# Patient Record
Sex: Female | Born: 1980 | Race: White | Hispanic: No | Marital: Single | Smoking: Never smoker
Health system: Southern US, Community
[De-identification: ages and names within clinical notes are randomized; demographics above are authoritative.]

## PROBLEM LIST (undated history)

## (undated) DIAGNOSIS — F32A Depression, unspecified: Secondary | ICD-10-CM

## (undated) DIAGNOSIS — E119 Type 2 diabetes mellitus without complications: Secondary | ICD-10-CM

## (undated) DIAGNOSIS — E079 Disorder of thyroid, unspecified: Secondary | ICD-10-CM

## (undated) DIAGNOSIS — E785 Hyperlipidemia, unspecified: Secondary | ICD-10-CM

## (undated) DIAGNOSIS — I10 Essential (primary) hypertension: Secondary | ICD-10-CM

## (undated) DIAGNOSIS — F329 Major depressive disorder, single episode, unspecified: Secondary | ICD-10-CM

## (undated) HISTORY — DX: Depression, unspecified: F32.A

## (undated) HISTORY — DX: Disorder of thyroid, unspecified: E07.9

## (undated) HISTORY — DX: Type 2 diabetes mellitus without complications: E11.9

## (undated) HISTORY — DX: Essential (primary) hypertension: I10

## (undated) HISTORY — DX: Hyperlipidemia, unspecified: E78.5

## (undated) HISTORY — DX: Major depressive disorder, single episode, unspecified: F32.9

## (undated) HISTORY — PX: WISDOM TOOTH EXTRACTION: SHX21

## (undated) HISTORY — PX: TUMOR EXCISION: SHX421

---

## 2014-05-20 ENCOUNTER — Telehealth: Payer: Self-pay | Admitting: Family Medicine

## 2014-05-20 NOTE — Telephone Encounter (Signed)
Give call to triageElnita Tate- Cheryl, FNP @ [facility] wants this pt seen for elevated blood sugar.

## 2014-05-25 ENCOUNTER — Encounter: Payer: Self-pay | Admitting: Family Medicine

## 2014-05-25 ENCOUNTER — Encounter (INDEPENDENT_AMBULATORY_CARE_PROVIDER_SITE_OTHER): Payer: Self-pay

## 2014-05-25 ENCOUNTER — Ambulatory Visit (INDEPENDENT_AMBULATORY_CARE_PROVIDER_SITE_OTHER): Payer: BC Managed Care – PPO | Admitting: Family Medicine

## 2014-05-25 VITALS — BP 123/80 | HR 87 | Temp 98.9°F | Ht 68.0 in | Wt 191.0 lb

## 2014-05-25 DIAGNOSIS — E785 Hyperlipidemia, unspecified: Secondary | ICD-10-CM

## 2014-05-25 DIAGNOSIS — R739 Hyperglycemia, unspecified: Secondary | ICD-10-CM

## 2014-05-25 DIAGNOSIS — R7309 Other abnormal glucose: Secondary | ICD-10-CM

## 2014-05-25 LAB — GLUCOSE, POCT (MANUAL RESULT ENTRY): POC Glucose: 351 mg/dl — AB (ref 70–99)

## 2014-05-25 LAB — POCT GLYCOSYLATED HEMOGLOBIN (HGB A1C): Hemoglobin A1C: >14

## 2014-05-25 MED ORDER — GLUCOSE BLOOD VI STRP: ORAL_STRIP | Status: DC

## 2014-05-25 MED ORDER — METFORMIN HCL 500 MG PO TABS: 500.0000 mg | ORAL_TABLET | Freq: Two times a day (BID) | ORAL | Status: DC

## 2014-05-25 MED ORDER — ATORVASTATIN CALCIUM 20 MG PO TABS: 20.0000 mg | ORAL_TABLET | Freq: Every day | ORAL | Status: DC

## 2014-05-25 MED ORDER — ONETOUCH ULTRASOFT LANCETS MISC: Status: DC

## 2014-05-25 NOTE — Progress Notes (Signed)
   Subjective:    Patient ID: Krista Tate, female    DOB: 04-07-1981, 33 y.o.   MRN: [id number]  HPI 33 year old female who apparently had some screening lab work done at her employer and was found to have markedly elevated blood sugar as well as cholesterol. She has been asymptomatic denying polyuria polydipsia change in vision or weight loss. She does have a positive family history of diabetes. In addition she has low level of vitamin D     Review of Systems  Constitutional: Negative.   HENT: Negative.   Eyes: Negative.   Respiratory: Negative.   Cardiovascular: Negative.   Gastrointestinal: Negative.   Endocrine: Negative.   Musculoskeletal: Negative.   Skin: Negative.   Neurological: Negative.   Psychiatric/Behavioral: Negative.        Objective:   Physical Exam  Constitutional: She is oriented to person, place, and time. She appears well-developed and well-nourished.  Eyes: Conjunctivae and EOM are normal.  Neck: Normal range of motion. Neck supple.  Cardiovascular: Normal rate, regular rhythm and normal heart sounds.   Pulmonary/Chest: Effort normal and breath sounds normal.  Abdominal: Soft. Bowel sounds are normal.  Musculoskeletal: Normal range of motion.  Neurological: She is alert and oriented to person, place, and time. She has normal reflexes.  Skin: Skin is warm and dry.  Psychiatric: She has a normal mood and affect. Her behavior is normal. Thought content normal.          Assessment & Plan:  The primary encounter diagnosis was Hyperglycemia. A diagnosis of Other and unspecified hyperlipidemia was also pertinent to this visit. A1c > 14 Begin Metformin and Atorvaqstatin Info re DM Re-check in 1 month  Frederica KusterStephen M Miller MD

## 2014-05-25 NOTE — Patient Instructions (Signed)
Diabetes Mellitus and Food °It is important for you to manage your blood sugar (glucose) level. Your blood glucose level can be greatly affected by what you eat. Eating healthier foods in the appropriate amounts throughout the day at about the same time each day will help you control your blood glucose level. It can also help slow or prevent worsening of your diabetes mellitus. Healthy eating may even help you improve the level of your blood pressure and reach or maintain a healthy weight.  °HOW CAN FOOD AFFECT ME? °Carbohydrates °Carbohydrates affect your blood glucose level more than any other type of food. Your dietitian will help you determine how many carbohydrates to eat at each meal and teach you how to count carbohydrates. Counting carbohydrates is important to keep your blood glucose at a healthy level, especially if you are using insulin or taking certain medicines for diabetes mellitus. °Alcohol °Alcohol can cause sudden decreases in blood glucose (hypoglycemia), especially if you use insulin or take certain medicines for diabetes mellitus. Hypoglycemia can be a life-threatening condition. Symptoms of hypoglycemia (sleepiness, dizziness, and disorientation) are similar to symptoms of having too much alcohol.  °If your health care provider has given you approval to drink alcohol, do so in moderation and use the following guidelines: °· Women should not have more than one drink per day, and men should not have more than two drinks per day. One drink is equal to: °¨ 12 oz of beer. °¨ 5 oz of wine. °¨ 1½ oz of hard liquor. °· Do not drink on an empty stomach. °· Keep yourself hydrated. Have water, diet soda, or unsweetened iced tea. °· Regular soda, juice, and other mixers might contain a lot of carbohydrates and should be counted. °WHAT FOODS ARE NOT RECOMMENDED? °As you make food choices, it is important to remember that all foods are not the same. Some foods have fewer nutrients per serving than other  foods, even though they might have the same number of calories or carbohydrates. It is difficult to get your body what it needs when you eat foods with fewer nutrients. Examples of foods that you should avoid that are high in calories and carbohydrates but low in nutrients include: °· Trans fats (most processed foods list trans fats on the Nutrition Facts label). °· Regular soda. °· Juice. °· Candy. °· Sweets, such as cake, pie, doughnuts, and cookies. °· Fried foods. °WHAT FOODS CAN I EAT? °Have nutrient-rich foods, which will nourish your body and keep you healthy. The food you should eat also will depend on several factors, including: °· The calories you need. °· The medicines you take. °· Your weight. °· Your blood glucose level. °· Your blood pressure level. °· Your cholesterol level. °You also should eat a variety of foods, including: °· Protein, such as meat, poultry, fish, tofu, nuts, and seeds (lean animal proteins are best). °· Fruits. °· Vegetables. °· Dairy products, such as milk, cheese, and yogurt (low fat is best). °· Breads, grains, pasta, cereal, rice, and beans. °· Fats such as olive oil, trans fat-free margarine, canola oil, avocado, and olives. °DOES EVERYONE WITH DIABETES MELLITUS HAVE THE SAME MEAL PLAN? °Because every person with diabetes mellitus is different, there is not one meal plan that works for everyone. It is very important that you meet with a dietitian who will help you create a meal plan that is just right for you. °Document Released: 07/18/2005 Document Revised: 10/26/2013 Document Reviewed: 09/17/2013 °ExitCare® Patient Information ©2015 [employer]. This   information is not intended to replace advice given to you by your health care provider. Make sure you discuss any questions you have with your health care provider. ° °Hyperglycemia °Hyperglycemia occurs when the glucose (sugar) in your blood is too high. Hyperglycemia can happen for many reasons, but it most often happens to  people who do not know they have diabetes or are not managing their diabetes properly.  °CAUSES  °Whether you have diabetes or not, there are other causes of hyperglycemia. Hyperglycemia can occur when you have diabetes, but it can also occur in other situations that you might not be as aware of, such as: °Diabetes °· If you have diabetes and are having problems controlling your blood glucose, hyperglycemia could occur because of some of the following reasons: °¨ Not following your meal plan. °¨ Not taking your diabetes medications or not taking it properly. °¨ Exercising less or doing less activity than you normally do. °¨ Being sick. °Pre-diabetes °· This cannot be ignored. Before people develop Type 2 diabetes, they almost always have "pre-diabetes." This is when your blood glucose levels are higher than normal, but not yet high enough to be diagnosed as diabetes. Research has shown that some long-term damage to the body, especially the heart and circulatory system, may already be occurring during pre-diabetes. If you take action to manage your blood glucose when you have pre-diabetes, you may delay or prevent Type 2 diabetes from developing. °Stress °· If you have diabetes, you may be "diet" controlled or on oral medications or insulin to control your diabetes. However, you may find that your blood glucose is higher than usual in the hospital whether you have diabetes or not. This is often referred to as "stress hyperglycemia." Stress can elevate your blood glucose. This happens because of hormones put out by the body during times of stress. If stress has been the cause of your high blood glucose, it can be followed regularly by your caregiver. That way he/she can make sure your hyperglycemia does not continue to get worse or progress to diabetes. °Steroids °· Steroids are medications that act on the infection fighting system (immune system) to block inflammation or infection. One side effect can be a rise in  blood glucose. Most people can produce enough extra insulin to allow for this rise, but for those who cannot, steroids make blood glucose levels go even higher. It is not unusual for steroid treatments to "uncover" diabetes that is developing. It is not always possible to determine if the hyperglycemia will go away after the steroids are stopped. A special blood test called an A1c is sometimes done to determine if your blood glucose was elevated before the steroids were started. °SYMPTOMS °· Thirsty. °· Frequent urination. °· Dry mouth. °· Blurred vision. °· Tired or fatigue. °· Weakness. °· Sleepy. °· Tingling in feet or leg. °DIAGNOSIS  °Diagnosis is made by monitoring blood glucose in one or all of the following ways: °· A1c test. This is a chemical found in your blood. °· Fingerstick blood glucose monitoring. °· Laboratory results. °TREATMENT  °First, knowing the cause of the hyperglycemia is important before the hyperglycemia can be treated. Treatment may include, but is not be limited to: °· Education. °· Change or adjustment in medications. °· Change or adjustment in meal plan. °· Treatment for an illness, infection, etc. °· More frequent blood glucose monitoring. °· Change in exercise plan. °· Decreasing or stopping steroids. °· Lifestyle changes. °HOME CARE INSTRUCTIONS  °· Test your   blood glucose as directed. °· Exercise regularly. Your caregiver will give you instructions about exercise. Pre-diabetes or diabetes which comes on with stress is helped by exercising. °· Eat wholesome, balanced meals. Eat often and at regular, fixed times. Your caregiver or nutritionist will give you a meal plan to guide your sugar intake. °· Being at an ideal weight is important. If needed, losing as little as 10 to 15 pounds may help improve blood glucose levels. °SEEK MEDICAL CARE IF:  °· You have questions about medicine, activity, or diet. °· You continue to have symptoms (problems such as increased thirst, urination, or  weight gain). °SEEK IMMEDIATE MEDICAL CARE IF:  °· You are vomiting or have diarrhea. °· Your breath smells fruity. °· You are breathing faster or slower. °· You are very sleepy or incoherent. °· You have numbness, tingling, or pain in your feet or hands. °· You have chest pain. °· Your symptoms get worse even though you have been following your caregiver's orders. °· If you have any other questions or concerns. °Document Released: 04/16/2001 Document Revised: 01/13/2012 Document Reviewed: 02/17/2012 °ExitCare® Patient Information ©2015 [employer]. This information is not intended to replace advice given to you by your health care provider. Make sure you discuss any questions you have with your health care provider. ° °

## 2014-05-25 NOTE — Progress Notes (Signed)
Glucometer given and advised on use.  Patient to report any consistent readings over 200 or below 70.  She will check blood sugar fasting in the morning, 2 hours after supper, and PRN.

## 2014-05-30 ENCOUNTER — Telehealth: Payer: Self-pay | Admitting: Family Medicine

## 2014-05-30 NOTE — Telephone Encounter (Signed)
Message copied by Azalee CourseFULP, Bernadine Melecio on Mon May 30, 2014  9:11 AM ------      Message from: Frederica KusterMILLER, STEPHEN M      Created: Sun May 29, 2014  5:26 PM       Therapy initiated; results expected.  Encourage follow-up ------

## 2014-05-31 MED ORDER — ONETOUCH DELICA LANCETS 33G MISC: Status: DC

## 2014-05-31 NOTE — Telephone Encounter (Signed)
[facility]

## 2014-05-31 NOTE — Telephone Encounter (Signed)
Okay to call correct lancets

## 2014-06-27 ENCOUNTER — Ambulatory Visit (INDEPENDENT_AMBULATORY_CARE_PROVIDER_SITE_OTHER): Payer: BC Managed Care – PPO | Admitting: Pharmacist

## 2014-06-27 ENCOUNTER — Encounter: Payer: Self-pay | Admitting: Pharmacist

## 2014-06-27 VITALS — BP 120/77 | HR 80 | Ht 68.0 in | Wt 196.5 lb

## 2014-06-27 DIAGNOSIS — IMO0002 Reserved for concepts with insufficient information to code with codable children: Secondary | ICD-10-CM | POA: Insufficient documentation

## 2014-06-27 DIAGNOSIS — E1165 Type 2 diabetes mellitus with hyperglycemia: Secondary | ICD-10-CM

## 2014-06-27 DIAGNOSIS — E1140 Type 2 diabetes mellitus with diabetic neuropathy, unspecified: Secondary | ICD-10-CM | POA: Insufficient documentation

## 2014-06-27 DIAGNOSIS — IMO0001 Reserved for inherently not codable concepts without codable children: Secondary | ICD-10-CM

## 2014-06-27 DIAGNOSIS — E1169 Type 2 diabetes mellitus with other specified complication: Secondary | ICD-10-CM

## 2014-06-27 DIAGNOSIS — E785 Hyperlipidemia, unspecified: Secondary | ICD-10-CM

## 2014-06-27 MED ORDER — CANAGLIFLOZIN 100 MG PO TABS: 1.0000 | ORAL_TABLET | Freq: Every day | ORAL | Status: DC

## 2014-06-27 NOTE — Progress Notes (Signed)
Subjective:    Krista Tate is a 33 y.o. female who presents for an initial evaluation of Type 2 diabetes mellitus.  Current symptoms/problems include hyperglycemia with HBG readings mostly in the 200's and 300's, polydipsia, polyuria and occassional nausea and have been improving. Symptoms have been present for 2 months.  The patient was initially diagnosed with Type 2 diabetes mellitus based on the following criteria:  A1c which was greater than 14% and RBG of 351. Diagnosed 05/2014  Known diabetic complications: none Cardiovascular risk factors: diabetes mellitus, dyslipidemia, obesity (BMI >= 30 kg/m2) and sedentary lifestyle Current diabetic medications include oral agent (monotherapy): metformin (generic)  bid.   Eye exam current (within one year): no Weight trend: fluctuating a bit Prior visit with dietician: no Current diet: in general, an "unhealthy" diet Current exercise: none  Current monitoring regimen: home blood tests - 2 to 3 times daily Home blood sugar records: Recent HBG readings - 362, 312, 461, 335, 260, 245, 277, 199, 211, 230, 242, 225, 189. Any episodes of hypoglycemia? no  Is She on ACE inhibitor or angiotensin II receptor blocker?  No   The following portions of the patient's history were reviewed and updated as appropriate: allergies, current medications, past family history, past medical history, past social history, past surgical history and problem list.  Objective:    BP 120/77  Pulse 80  Ht  (1.727 m)  Wt 196 lb 8 oz (89.132 kg)  BMI 29.88 kg/m2  Lab Review No results found for this basename: GLUCOSE, SODIUM, POTASSIUM, CHLORIDE, CO2, BUN, CREATININE   Lat A1c was greater than 14%   Assessment:    Diabetes Mellitus type II, under inadequate control.    Plan:    1.  Rx changes: add Invokana  1 tablet qam, Continue metformin  1 table BID (if nausea improves in the future will increase metformin as needed) 2.  Education:  Reviewed 'ABCs' of diabetes management (respective goals in parentheses):  A1C (<7), blood pressure (<130/80), and cholesterol (LDL <100). 3.  Compliance at present is estimated to be fair. Efforts to improve compliance (if necessary) will be directed at dietary modifications: 30 minutes spent discussing CHO counting, serving size recommendations, 45grams CHO per meal, 15 grams per snack.  , increased exercise and regular blood sugar monitoring: 2 to 3 times daily. 4. Follow up: 4 weeks   Henrene Pastor, PharmD, CPP, CDE

## 2014-06-27 NOTE — Patient Instructions (Signed)
Diabetes and Standards of Medical Care  Diabetes is complicated. You may find that your diabetes team includes a dietitian, nurse, diabetes educator, eye doctor, and more. To help everyone know what is going on and to help you get the care you deserve, the following schedule of care was developed to help keep you on track. Below are the tests, exams, vaccines, medicines, education, and plans you will need.  Blood Glucose Goals Prior to meals = 80 - 130 Within 2 hours of the start of a meal = less than 180  HbA1c test (goal is less than 7.0% - your last value was over 14%) This test shows how well you have controlled your glucose over the past 2 to 3 months. It is used to see if your diabetes management plan needs to be adjusted.   It is performed at least 2 times a year if you are meeting treatment goals.  It is performed 4 times a year if therapy has changed or if you are not meeting treatment goals.   Blood pressure test  This test is performed at every routine medical visit. The goal is less than 140/90 mmHg for most people, but 130/80 mmHg in some cases. Ask your health care provider about your goal. Dental exam  Follow up with the dentist regularly. Eye exam  If you are diagnosed with type 1 diabetes as a child, get an exam upon reaching the age of 31 years or older and have had diabetes for 3 5 years. Yearly eye exams are recommended after that initial eye exam.  If you are diagnosed with type 1 diabetes as an adult, get an exam within 5 years of diagnosis and then yearly.  If you are diagnosed with type 2 diabetes, get an exam as soon as possible after the diagnosis and then yearly. Foot care exam  Visual foot exams are performed at every routine medical visit. The exams check for cuts, injuries, or other problems with the feet.  A comprehensive foot exam should be done yearly. This includes visual inspection as well as assessing foot pulses and testing for loss of  sensation.  Check your feet nightly for cuts, injuries, or other problems with your feet. Tell your health care provider if anything is not healing. Kidney function test (urine microalbumin)  This test is performed once a year.  Type 1 diabetes: The first test is performed 5 years after diagnosis.  Type 2 diabetes: The first test is performed at the time of diagnosis.  A serum creatinine and estimated glomerular filtration rate (eGFR) test is done once a year to assess the level of chronic kidney disease (CKD), if present. Lipid profile (cholesterol, HDL, LDL, triglycerides)  Performed every 5 years for most people.  The goal for LDL is less than 100 mg/dL. If you are at high risk, the goal is less than 70 mg/dL.  The goal for HDL is 40 mg/dL 50 mg/dL for men and 50 mg/dL 60 mg/dL for women. An HDL cholesterol of 60 mg/dL or higher gives some protection against heart disease.  The goal for triglycerides is less than 150 mg/dL. Influenza vaccine, pneumococcal vaccine, and hepatitis B vaccine  The influenza vaccine is recommended yearly.  The pneumococcal vaccine is generally given once in a lifetime. However, there are some instances when another vaccination is recommended. Check with your health care provider.  The hepatitis B vaccine is also recommended for adults with diabetes. Diabetes self-management education  Education is recommended at diagnosis  and ongoing as needed. Treatment plan  Your treatment plan is reviewed at every medical visit. Document Released: 08/18/2009 Document Revised: 06/23/2013 Document Reviewed: 03/23/2013 [facility] Patient Information 2014 [place].

## 2014-06-28 ENCOUNTER — Ambulatory Visit: Payer: BC Managed Care – PPO | Admitting: Family Medicine

## 2014-07-20 ENCOUNTER — Ambulatory Visit (INDEPENDENT_AMBULATORY_CARE_PROVIDER_SITE_OTHER): Payer: BC Managed Care – PPO | Admitting: Family Medicine

## 2014-07-20 ENCOUNTER — Encounter: Payer: Self-pay | Admitting: Family Medicine

## 2014-07-20 VITALS — BP 122/77 | HR 89 | Temp 98.2°F | Ht 68.0 in | Wt 193.0 lb

## 2014-07-20 DIAGNOSIS — R7309 Other abnormal glucose: Secondary | ICD-10-CM

## 2014-07-20 DIAGNOSIS — E785 Hyperlipidemia, unspecified: Secondary | ICD-10-CM

## 2014-07-20 DIAGNOSIS — R739 Hyperglycemia, unspecified: Secondary | ICD-10-CM

## 2014-07-20 MED ORDER — ATORVASTATIN CALCIUM 20 MG PO TABS: 20.0000 mg | ORAL_TABLET | Freq: Every day | ORAL | Status: DC

## 2014-07-20 MED ORDER — GABAPENTIN 300 MG PO CAPS: 300.0000 mg | ORAL_CAPSULE | Freq: Three times a day (TID) | ORAL | Status: DC

## 2014-07-20 MED ORDER — METFORMIN HCL 500 MG PO TABS: 500.0000 mg | ORAL_TABLET | Freq: Two times a day (BID) | ORAL | Status: DC

## 2014-07-20 NOTE — Patient Instructions (Signed)
Diabetes and Foot Care Diabetes may cause you to have problems because of poor blood supply (circulation) to your feet and legs. This may cause the skin on your feet to become thinner, break easier, and heal more slowly. Your skin may become dry, and the skin may peel and crack. You may also have nerve damage in your legs and feet causing decreased feeling in them. You may not notice minor injuries to your feet that could lead to infections or more serious problems. Taking care of your feet is one of the most important things you can do for yourself.  HOME CARE INSTRUCTIONS  Wear shoes at all times, even in the house. Do not go barefoot. Bare feet are easily injured.  Check your feet daily for blisters, cuts, and redness. If you cannot see the bottom of your feet, use a mirror or ask someone for help.  Wash your feet with warm water (do not use hot water) and mild soap. Then pat your feet and the areas between your toes until they are completely dry. Do not soak your feet as this can dry your skin.  Apply a moisturizing lotion or petroleum jelly (that does not contain alcohol and is unscented) to the skin on your feet and to dry, brittle toenails. Do not apply lotion between your toes.  Trim your toenails straight across. Do not dig under them or around the cuticle. File the edges of your nails with an emery board or nail file.  Do not cut corns or calluses or try to remove them with medicine.  Wear clean socks or stockings every day. Make sure they are not too tight. Do not wear knee-high stockings since they may decrease blood flow to your legs.  Wear shoes that fit properly and have enough cushioning. To break in new shoes, wear them for just a few hours a day. This prevents you from injuring your feet. Always look in your shoes before you put them on to be sure there are no objects inside.  Do not cross your legs. This may decrease the blood flow to your feet.  If you find a minor scrape,  cut, or break in the skin on your feet, keep it and the skin around it clean and dry. These areas may be cleansed with mild soap and water. Do not cleanse the area with peroxide, alcohol, or iodine.  When you remove an adhesive bandage, be sure not to damage the skin around it.  If you have a wound, look at it several times a day to make sure it is healing.  Do not use heating pads or hot water bottles. They may burn your skin. If you have lost feeling in your feet or legs, you may not know it is happening until it is too late.  Make sure your health care provider performs a complete foot exam at least annually or more often if you have foot problems. Report any cuts, sores, or bruises to your health care provider immediately. SEEK MEDICAL CARE IF:   You have an injury that is not healing.  You have cuts or breaks in the skin.  You have an ingrown nail.  You notice redness on your legs or feet.  You feel burning or tingling in your legs or feet.  You have pain or cramps in your legs and feet.  Your legs or feet are numb.  Your feet always feel cold. SEEK IMMEDIATE MEDICAL CARE IF:   There is increasing redness,   swelling, or pain in or around a wound.  There is a red line that goes up your leg.  Pus is coming from a wound.  You develop a fever or as directed by your health care provider.  You notice a bad smell coming from an ulcer or wound. Document Released: 10/18/2000 Document Revised: 06/23/2013 Document Reviewed: 03/30/2013 [employer] Patient Information 2015 [employer]. This information is not intended to replace advice given to you by your health care provider. Make sure you discuss any questions you have with your health care provider.  

## 2014-07-20 NOTE — Progress Notes (Signed)
   Subjective:    Patient ID: Krista Tate, female    DOB: [birth year], 33 y.o.   MRN: [id number]  HPI   33 year old female here to followup diabetes. Since I started her on metformin at the initial visit she has also been placed on Invokana and had some diabetic education as far as carbohydrate intake. Today she's complaining more of pain in her feet with burning and shooting pains in her work. The pain actually preceded the diagnosisup her legs. She does stand for up to 12 hours a day   Review of Systems  Constitutional: Negative.   HENT: Negative.   Eyes: Negative.   Respiratory: Negative.   Cardiovascular: Negative.   Gastrointestinal: Negative.   Endocrine: Negative.   Genitourinary: Negative.   Musculoskeletal: Positive for myalgias.       Bilateral thumb pain  Skin: Rash: not true rash but healing intertrigo.  Hematological: Negative.   Psychiatric/Behavioral: Negative.        Objective:   Physical Exam  Constitutional: She is oriented to person, place, and time. She appears well-developed and well-nourished.  Eyes: Conjunctivae and EOM are normal.  Neck: Normal range of motion. Neck supple.  Cardiovascular: Normal rate, regular rhythm and normal heart sounds.   Pulmonary/Chest: Effort normal and breath sounds normal.  Abdominal: Soft. Bowel sounds are normal.  Musculoskeletal: Normal range of motion.  Exam shows normal microfilament testing intact pulses.  Neurological: She is alert and oriented to person, place, and time. She has normal reflexes.  Skin: Skin is warm and dry.  Psychiatric: She has a normal mood and affect. Her behavior is normal. Thought content normal.    BP 122/77  Pulse 89  Temp(Src) 98.2 F (36.8 C) (Oral)  Ht  (1.727 m)  Wt 193 lb (87.544 kg)  BMI 29.35 kg/m2  LMP 06/28/2014      Assessment & Plan:  1. Other and unspecified hyperlipidemia  - atorvastatin (LIPITOR) 20 MG tablet; Take 1 tablet (20 mg total) by mouth daily.   Dispense: 30 tablet; Refill: 1  2. Hyperglycemia  - metFORMIN (GLUCOPHAGE) 500 MG tablet; Take 1 tablet (500 mg total) by mouth 2 (two) times daily with a meal.  Dispense: 60 tablet; Refill: 1  3.Neuropathy Suspect this is cause of her pain; begin neurontin 300 mg  Frederica Kuster MD

## 2014-08-09 ENCOUNTER — Other Ambulatory Visit: Payer: Self-pay | Admitting: Pharmacist

## 2014-08-10 ENCOUNTER — Telehealth: Payer: Self-pay | Admitting: Family Medicine

## 2014-08-11 ENCOUNTER — Telehealth: Payer: Self-pay | Admitting: Family Medicine

## 2014-08-11 NOTE — Telephone Encounter (Signed)
Sent Rx in earlier today

## 2014-08-23 ENCOUNTER — Telehealth: Payer: Self-pay | Admitting: *Deleted

## 2014-08-23 ENCOUNTER — Telehealth: Payer: Self-pay | Admitting: Family Medicine

## 2014-08-23 ENCOUNTER — Encounter: Payer: Self-pay | Admitting: *Deleted

## 2014-08-23 DIAGNOSIS — R739 Hyperglycemia, unspecified: Secondary | ICD-10-CM

## 2014-08-23 NOTE — Telephone Encounter (Signed)
Not received pt will call [facility]

## 2014-08-23 NOTE — Telephone Encounter (Signed)
This encounter was created in error - please disregard.

## 2014-08-23 NOTE — Telephone Encounter (Signed)
Pt needs refill on atorvastatin and Metformin to [facility]. I couldn't order meds because have to add dx code to rx. Please change phamracy to [facility] when ordering. Thanks

## 2014-08-23 NOTE — Telephone Encounter (Signed)
done

## 2014-08-25 ENCOUNTER — Other Ambulatory Visit: Payer: Self-pay | Admitting: *Deleted

## 2014-08-25 DIAGNOSIS — E1169 Type 2 diabetes mellitus with other specified complication: Secondary | ICD-10-CM

## 2014-08-25 DIAGNOSIS — E785 Hyperlipidemia, unspecified: Secondary | ICD-10-CM

## 2014-08-25 DIAGNOSIS — R739 Hyperglycemia, unspecified: Secondary | ICD-10-CM

## 2014-08-25 MED ORDER — ATORVASTATIN CALCIUM 20 MG PO TABS: 20.0000 mg | ORAL_TABLET | Freq: Every day | ORAL | Status: DC

## 2014-08-25 MED ORDER — METFORMIN HCL 500 MG PO TABS: 500.0000 mg | ORAL_TABLET | Freq: Two times a day (BID) | ORAL | Status: DC

## 2014-08-25 NOTE — Telephone Encounter (Signed)
Codes: dyslipidemia E11.69              Diabetes E11.65

## 2014-08-25 NOTE — Telephone Encounter (Signed)
done

## 2014-08-29 ENCOUNTER — Ambulatory Visit: Payer: Self-pay

## 2014-09-01 ENCOUNTER — Encounter: Payer: Self-pay | Admitting: Pharmacist

## 2014-09-01 ENCOUNTER — Ambulatory Visit (INDEPENDENT_AMBULATORY_CARE_PROVIDER_SITE_OTHER): Payer: BC Managed Care – PPO | Admitting: Pharmacist

## 2014-09-01 VITALS — BP 122/78 | HR 80 | Ht 68.0 in | Wt 191.0 lb

## 2014-09-01 DIAGNOSIS — E785 Hyperlipidemia, unspecified: Secondary | ICD-10-CM

## 2014-09-01 DIAGNOSIS — E1165 Type 2 diabetes mellitus with hyperglycemia: Secondary | ICD-10-CM

## 2014-09-01 DIAGNOSIS — R079 Chest pain, unspecified: Secondary | ICD-10-CM

## 2014-09-01 DIAGNOSIS — Z23 Encounter for immunization: Secondary | ICD-10-CM

## 2014-09-01 DIAGNOSIS — IMO0002 Reserved for concepts with insufficient information to code with codable children: Secondary | ICD-10-CM

## 2014-09-01 DIAGNOSIS — E1169 Type 2 diabetes mellitus with other specified complication: Secondary | ICD-10-CM

## 2014-09-01 LAB — POCT GLYCOSYLATED HEMOGLOBIN (HGB A1C): Hemoglobin A1C: 8.2

## 2014-09-01 NOTE — Progress Notes (Signed)
Subjective:    Krista Tate is a 33 y.o. female who presents for reevaluation of Type 2 diabetes mellitus. She was last seen by myself 2 months ago.  Current symptoms/problems include hyperglycemia with HBG readings mostly in the 200's, polydipsia, polyuria and occassional nausea and have been improving.  She also c/o of chest pain for last week.  She thought it might be related to inhaling dust at her workplace and tried Mucus relief but this has not helped and she states she is not coughing or SOB.  She has not tried any PPI's or acid relieving medications.  She has not noticed any increase in pain with food or related to exertion.  The patient was initially diagnosed with Type 2 diabetes mellitus 05/2014 when  A1c was greater than 14% and RBG of 351.  Known diabetic complications: peripheral neuropathy Cardiovascular risk factors: diabetes mellitus, dyslipidemia, obesity (BMI >= 30 kg/m2) and sedentary lifestyle Current diabetic medications include oral agent (monotherapy): metformin (generic) 500mg  bid.   Eye exam current (within one year): no Weight trend: decrease about 6# since first visit with CDE Prior visit with dietician: no Current diet: has improved since last visit - limiting high CHO foods and serving sizes Current exercise: none  Current monitoring regimen: home blood tests - 2 to 3 times daily Home blood sugar records: Recent HBG readings - 178, 164, 177. 159, 194, 166, 194, 172, 175, 169, 189, 177, 208, 203, 251 (ran out of metformin) Any episodes of hypoglycemia? no  Is She on ACE inhibitor or angiotensin II receptor blocker?  No   The following portions of the patient's history were reviewed and updated as appropriate: allergies, current medications, past family history, past medical history, past social history, past surgical history and problem list.  Objective:    BP 122/78  Pulse 80  Ht 5\' 8"  (1.727 m)  Wt 191 lb (86.637 kg)  BMI 29.05 kg/m2  Lab Review No  results found for this basename: GLUCOSE,  SODIUM,  POTASSIUM,  CHLORIDE,  CO2,  BUN,  CREATININE   Lat A1c today was 8.2% EKG performed and reviewed by Dr Laurance Flatten - did not note any significant changes.   Assessment:    Diabetes Mellitus type II, under inadequate control.  - patient ran out of metfromin and atorvastatin for last 7 to 10 days due to problems with mail order but she assures me that they are in mail and should be delivered in 1-2 days. CP of unknown etiology - r/o MI   Plan:    1.  Rx changes: add Invokana 100mg  1 tablet qam, Continue metformin 500mg  1 table BID (if nausea improves in the future will increase metformin as needed)  Recommended trial of OTC prilosec to see if CP related to reflux / heartburn - per Dr Tawanna Sat recommendation 2.  Education: Reviewed 'ABCs' of diabetes management (respective goals in parentheses):  A1C (<7), blood pressure (<130/80), and cholesterol (LDL <100). 3.   Reviewed CHO counting, serving size recommendations, 45grams CHO per meal, 15 grams per snack.   4.   Continue regular blood sugar monitoring: 1 to 2 times daily. 5. RTC to see PCP to check CP - consider stress test. 6.   Influenza vaccine given in office today 7. Follow up: 4 weeks   Orders Placed This Encounter  Procedures  . CMP14+EGFR  . Microalbumin, urine  . POCT glycosylated hemoglobin (Hb A1C)  . EKG 12-Lead     Cherre Robins, PharmD, CPP, CDE

## 2014-09-01 NOTE — Patient Instructions (Signed)
Diabetes and Standards of Medical Care  Diabetes is complicated. You may find that your diabetes team includes a dietitian, nurse, diabetes educator, eye doctor, and more. To help everyone know what is going on and to help you get the care you deserve, the following schedule of care was developed to help keep you on track. Below are the tests, exams, vaccines, medicines, education, and plans you will need.  Blood Glucose Goals Prior to meals = 80 - 130 Within 2 hours of the start of a meal = less than 180  HbA1c test (goal is less than 7.0% - your last value was 8.2%) This test shows how well you have controlled your glucose over the past 2 3 months. It is used to see if your diabetes management plan needs to be adjusted.   It is performed at least 2 times a year if you are meeting treatment goals.  It is performed 4 times a year if therapy has changed or if you are not meeting treatment goals.   Blood pressure test  This test is performed at every routine medical visit. The goal is less than 140/90 mmHg for most people, but 130/80 mmHg in some cases. Ask your health care provider about your goal. Dental exam  Follow up with the dentist regularly. Eye exam  If you are diagnosed with type 1 diabetes as a child, get an exam upon reaching the age of 22 years or older and have had diabetes for 3 5 years. Yearly eye exams are recommended after that initial eye exam.  If you are diagnosed with type 1 diabetes as an adult, get an exam within 5 years of diagnosis and then yearly.  If you are diagnosed with type 2 diabetes, get an exam as soon as possible after the diagnosis and then yearly. Foot care exam  Visual foot exams are performed at every routine medical visit. The exams check for cuts, injuries, or other problems with the feet.  A comprehensive foot exam should be done yearly. This includes visual inspection as well as assessing foot pulses and testing for loss of sensation.  Check  your feet nightly for cuts, injuries, or other problems with your feet. Tell your health care provider if anything is not healing. Kidney function test (urine microalbumin)  This test is performed once a year.  Type 1 diabetes: The first test is performed 5 years after diagnosis.  Type 2 diabetes: The first test is performed at the time of diagnosis.  A serum creatinine and estimated glomerular filtration rate (eGFR) test is done once a year to assess the level of chronic kidney disease (CKD), if present. Lipid profile (cholesterol, HDL, LDL, triglycerides)  Performed every 5 years for most people.  The goal for LDL is less than 100 mg/dL. If you are at high risk, the goal is less than 70 mg/dL.  The goal for HDL is 40 mg/dL 50 mg/dL for men and 50 mg/dL 60 mg/dL for women. An HDL cholesterol of 60 mg/dL or higher gives some protection against heart disease.  The goal for triglycerides is less than 150 mg/dL. Influenza vaccine, pneumococcal vaccine, and hepatitis B vaccine  The influenza vaccine is recommended yearly.  The pneumococcal vaccine is generally given once in a lifetime. However, there are some instances when another vaccination is recommended. Check with your health care provider.  The hepatitis B vaccine is also recommended for adults with diabetes. Diabetes self-management education  Education is recommended at diagnosis and ongoing  as needed. Treatment plan  Your treatment plan is reviewed at every medical visit. Document Released: 08/18/2009 Document Revised: 06/23/2013 Document Reviewed: 03/23/2013 [facility] Patient Information 2014 [place].

## 2014-09-02 LAB — CMP14+EGFR
ALK PHOS: 69 IU/L (ref 39–117)
ALT: 12 IU/L (ref 0–32)
AST: 11 IU/L (ref 0–40)
Albumin/Globulin Ratio: 1.4 (ref 1.1–2.5)
Albumin: 4.1 g/dL (ref 3.5–5.5)
BUN / CREAT RATIO: 18 (ref 8–20)
BUN: 14 mg/dL (ref 6–20)
CO2: 23 mmol/L (ref 18–29)
CREATININE: 0.76 mg/dL (ref 0.57–1.00)
Calcium: 9.7 mg/dL (ref 8.7–10.2)
Chloride: 97 mmol/L (ref 97–108)
GFR calc non Af Amer: 103 mL/min/{1.73_m2} (ref >59)
GFR, EST AFRICAN AMERICAN: 118 mL/min/{1.73_m2} (ref >59)
GLOBULIN, TOTAL: 2.9 g/dL (ref 1.5–4.5)
Glucose: 233 mg/dL — ABNORMAL HIGH (ref 65–99)
Potassium: 4.8 mmol/L (ref 3.5–5.2)
Sodium: 136 mmol/L (ref 134–144)
Total Protein: 7 g/dL (ref 6.0–8.5)

## 2014-09-02 LAB — MICROALBUMIN, URINE: Microalbumin, Urine: 3.1 ug/mL (ref 0.0–17.0)

## 2014-09-05 ENCOUNTER — Telehealth: Payer: Self-pay | Admitting: Pharmacist

## 2014-09-05 NOTE — Telephone Encounter (Signed)
Called patient with lab results - all labs WNL except glucose still elevated.  Also need to schedule follow up visit with PCP with patient.  No answer - left message on VM

## 2014-09-08 NOTE — Telephone Encounter (Signed)
Left message again - TBE

## 2014-09-09 NOTE — Telephone Encounter (Signed)
Pt aware of lab results and made appt w/ Dr. Hyacinth MeekerMiller to rck glucose.  rs

## 2014-10-12 ENCOUNTER — Encounter: Payer: Self-pay | Admitting: Family Medicine

## 2014-10-12 ENCOUNTER — Ambulatory Visit (INDEPENDENT_AMBULATORY_CARE_PROVIDER_SITE_OTHER): Payer: BC Managed Care – PPO | Admitting: Family Medicine

## 2014-10-12 VITALS — BP 123/79 | HR 84 | Temp 97.2°F | Ht 68.0 in | Wt 200.0 lb

## 2014-10-12 DIAGNOSIS — IMO0002 Reserved for concepts with insufficient information to code with codable children: Secondary | ICD-10-CM

## 2014-10-12 DIAGNOSIS — Z23 Encounter for immunization: Secondary | ICD-10-CM

## 2014-10-12 DIAGNOSIS — E1165 Type 2 diabetes mellitus with hyperglycemia: Secondary | ICD-10-CM

## 2014-10-12 DIAGNOSIS — E1169 Type 2 diabetes mellitus with other specified complication: Secondary | ICD-10-CM

## 2014-10-12 DIAGNOSIS — E785 Hyperlipidemia, unspecified: Secondary | ICD-10-CM

## 2014-10-12 NOTE — Progress Notes (Signed)
   Subjective:    Patient ID: Krista Tate, female    DOB: 01/06/1981, 33 y.o.   MRN: [id number]  HPI  33 year old female here to follow-up diabetes. She also has some neuropathy and takes gabapentin. For diabetes she is on metformin and in the, her sugars are improving but still not at goal. Her last A1c 6 weeks ago was 8.2. She does describe some pain in her feet and we will adjust the dose of her gabapentin.  There is a family history of bipolar disease and on the depression questionnaire she scores high enough that she may warrant treatment. There are times when she lacks energy and just doesn't want to get out of bed or have little interest in things. I'm not sure she is truly bipolar but she is interested in trying something to help perhaps an antidepressant.    Review of Systems  Constitutional: Negative.   HENT: Negative.   Eyes: Negative.   Respiratory: Negative.   Cardiovascular: Negative.   Gastrointestinal: Negative.   Endocrine: Negative.   Genitourinary: Negative.   Hematological: Negative.   Psychiatric/Behavioral: Negative.        Objective:   Physical Exam  Constitutional: She is oriented to person, place, and time. She appears well-developed and well-nourished.  Eyes: Conjunctivae and EOM are normal.  Neck: Normal range of motion. Neck supple.  Cardiovascular: Normal rate, regular rhythm and normal heart sounds.   Pulmonary/Chest: Effort normal and breath sounds normal.  Abdominal: Soft. Bowel sounds are normal.  Musculoskeletal: Normal range of motion.  Neurological: She is alert and oriented to person, place, and time. She has normal reflexes.  Skin: Skin is warm and dry.  Psychiatric: She has a normal mood and affect. Her behavior is normal. Thought content normal.   BP 123/79 mmHg  Pulse 84  Temp(Src) 97.2 F (36.2 C) (Oral)  Ht 5\' 8"  (1.727 m)  Wt 200 lb (90.719 kg)  BMI 30.42 kg/m2  LMP 10/12/2014       Assessment & Plan:  1. DM (diabetes  mellitus), type 2, uncontrolled Continue with current regimen certainly could increase her metformin depending on her response to current treatment  2. Dyslipidemia associated with type 2 diabetes mellitus Had lipids done at work have asked her to bring results for review at her next visit  3. Depression Will add citalopram 20 mg and recheck in 2 months  Frederica KusterStephen M Miller MD

## 2014-10-13 ENCOUNTER — Other Ambulatory Visit: Payer: Self-pay | Admitting: *Deleted

## 2014-10-13 MED ORDER — CANAGLIFLOZIN 100 MG PO TABS: 100.0000 mg | ORAL_TABLET | Freq: Every day | ORAL | Status: DC

## 2014-10-13 NOTE — Telephone Encounter (Signed)
Pt was seen 10/12/14 but only mentioned Metformin in notes. Please refill if you want them to continue Invokana. Thanks.

## 2014-10-17 ENCOUNTER — Telehealth: Payer: Self-pay | Admitting: Family Medicine

## 2014-10-17 MED ORDER — CITALOPRAM HYDROBROMIDE 20 MG PO TABS: 20.0000 mg | ORAL_TABLET | Freq: Every day | ORAL | Status: DC

## 2014-10-17 NOTE — Telephone Encounter (Signed)
rx was sent into pharmacy for citalopram 20mg  per office note on 10/12/2014.

## 2014-11-10 ENCOUNTER — Telehealth: Payer: Self-pay | Admitting: Family Medicine

## 2014-11-10 MED ORDER — GABAPENTIN 300 MG PO CAPS: ORAL_CAPSULE | ORAL | Status: DC

## 2014-11-10 NOTE — Telephone Encounter (Signed)
It is okay to refill this for 5 pills daily as Dr. Hyacinth MeekerMiller has recommended. Please give the patient enough medication to last for one month at a time with refills and make sure the patient has an appointment for recheck.

## 2014-11-10 NOTE — Telephone Encounter (Signed)
Pt notified corrected RX sent into pharmacy

## 2014-11-10 NOTE — Telephone Encounter (Signed)
Per pt Dr Hyacinth MeekerMiller increased Gabapentin 300mg  to 2 pills in the AM 1 at lunch and 2 pills hs

## 2014-11-14 ENCOUNTER — Telehealth: Payer: Self-pay | Admitting: Family Medicine

## 2014-11-14 NOTE — Telephone Encounter (Signed)
[facility]

## 2014-11-17 ENCOUNTER — Encounter: Payer: Self-pay | Admitting: *Deleted

## 2014-11-30 ENCOUNTER — Telehealth: Payer: Self-pay | Admitting: Family Medicine

## 2014-11-30 DIAGNOSIS — E785 Hyperlipidemia, unspecified: Principal | ICD-10-CM

## 2014-11-30 DIAGNOSIS — E1169 Type 2 diabetes mellitus with other specified complication: Secondary | ICD-10-CM

## 2014-11-30 DIAGNOSIS — R739 Hyperglycemia, unspecified: Secondary | ICD-10-CM

## 2014-11-30 MED ORDER — METFORMIN HCL 500 MG PO TABS: 500.0000 mg | ORAL_TABLET | Freq: Two times a day (BID) | ORAL | Status: DC

## 2014-11-30 MED ORDER — ATORVASTATIN CALCIUM 20 MG PO TABS: 20.0000 mg | ORAL_TABLET | Freq: Every day | ORAL | Status: DC

## 2014-11-30 NOTE — Telephone Encounter (Signed)
done

## 2014-12-09 ENCOUNTER — Other Ambulatory Visit: Payer: Self-pay | Admitting: *Deleted

## 2014-12-09 DIAGNOSIS — R739 Hyperglycemia, unspecified: Secondary | ICD-10-CM

## 2014-12-09 MED ORDER — METFORMIN HCL 500 MG PO TABS: 500.0000 mg | ORAL_TABLET | Freq: Two times a day (BID) | ORAL | Status: DC

## 2014-12-13 ENCOUNTER — Encounter: Payer: Self-pay | Admitting: Family Medicine

## 2014-12-13 ENCOUNTER — Encounter (INDEPENDENT_AMBULATORY_CARE_PROVIDER_SITE_OTHER): Payer: Self-pay

## 2014-12-13 ENCOUNTER — Ambulatory Visit (INDEPENDENT_AMBULATORY_CARE_PROVIDER_SITE_OTHER): Payer: BLUE CROSS/BLUE SHIELD | Admitting: Family Medicine

## 2014-12-13 VITALS — BP 122/75 | HR 95 | Temp 98.2°F | Ht 68.0 in | Wt 200.0 lb

## 2014-12-13 DIAGNOSIS — IMO0002 Reserved for concepts with insufficient information to code with codable children: Secondary | ICD-10-CM

## 2014-12-13 DIAGNOSIS — F32A Depression, unspecified: Secondary | ICD-10-CM | POA: Insufficient documentation

## 2014-12-13 DIAGNOSIS — E1165 Type 2 diabetes mellitus with hyperglycemia: Secondary | ICD-10-CM

## 2014-12-13 DIAGNOSIS — F329 Major depressive disorder, single episode, unspecified: Secondary | ICD-10-CM

## 2014-12-13 DIAGNOSIS — E1140 Type 2 diabetes mellitus with diabetic neuropathy, unspecified: Secondary | ICD-10-CM

## 2014-12-13 LAB — POCT GLYCOSYLATED HEMOGLOBIN (HGB A1C): Hemoglobin A1C: 7.2

## 2014-12-13 MED ORDER — CANAGLIFLOZIN 100 MG PO TABS: 100.0000 mg | ORAL_TABLET | Freq: Every day | ORAL | Status: DC

## 2014-12-13 NOTE — Progress Notes (Signed)
   Subjective:    Patient ID: Krista Tate, female    DOB: [birth year], 34 y.o.   MRN: [id number]  HPI 34 year old female with diabetes and hyperlipidemia. She brings some numbers from labs at work and her lipids have improved dramatically but her last A1c was still 8.2. Current medicines include metformin and Invokana. We'll see where A1c is today and if it is still not at goal I would increase metformin to average dose of 1000 twice a day but if that increases side effects we may want to increase in number,. She has improved in terms of her depression as well as Neurontin with the addition of appropriate meds  Patient Active Problem List   Diagnosis Date Noted  . Depression   . Type 2 diabetes, uncontrolled, with neuropathy 06/27/2014  . Dyslipidemia associated with type 2 diabetes mellitus 06/27/2014   Outpatient Encounter Prescriptions as of 12/13/2014  Medication Sig  . atorvastatin (LIPITOR) 20 MG tablet Take 1 tablet (20 mg total) by mouth daily.  . canagliflozin (INVOKANA) 100 MG TABS tablet Take 1 tablet (100 mg total) by mouth daily.  . citalopram (CELEXA) 20 MG tablet Take 1 tablet (20 mg total) by mouth daily.  [name]. gabapentin (NEURONTIN) 300 MG capsule Take 2 capsules in the AM Take 1 capsule at lunch Take 2 capsules at bedtime  . glucose blood (ONETOUCH VERIO) test strip Use BID and PRN  . metFORMIN (GLUCOPHAGE) 500 MG tablet Take 1 tablet (500 mg total) by mouth 2 (two) times daily with a meal.  . neomycin-polymyxin-hydrocortisone (CORTISPORIN) otic solution   . ONETOUCH DELICA LANCETS 33G MISC bid  . [DISCONTINUED] canagliflozin (INVOKANA) 100 MG TABS tablet Take 1 tablet (100 mg total) by mouth daily.     Review of Systems  Constitutional: Negative.   HENT: Negative.   Eyes: Negative.   Respiratory: Negative.   Cardiovascular: Negative.   Gastrointestinal: Negative.   Endocrine: Negative.   Genitourinary: Negative.   Hematological: Negative.   Psychiatric/Behavioral:  Negative.        Objective:   Physical Exam  Constitutional: She is oriented to person, place, and time. She appears well-developed and well-nourished.  Eyes: Conjunctivae and EOM are normal.  Neck: Normal range of motion. Neck supple.  Cardiovascular: Normal rate, regular rhythm and normal heart sounds.   Pulmonary/Chest: Effort normal and breath sounds normal.  Abdominal: Soft. Bowel sounds are normal.  Musculoskeletal: Normal range of motion.  Neurological: She is alert and oriented to person, place, and time. She has normal reflexes.  Skin: Skin is warm and dry.  Psychiatric: She has a normal mood and affect. Her behavior is normal. Thought content normal.    BP 122/75 mmHg  Pulse 95  Temp(Src) 98.2 F (36.8 C) (Oral)  Ht 5\' 8"  (1.727 m)  Wt 200 lb (90.719 kg)  BMI 30.42 kg/m2       Assessment & Plan:

## 2014-12-13 NOTE — Patient Instructions (Signed)
Schedule eye exam

## 2014-12-15 ENCOUNTER — Ambulatory Visit: Payer: BC Managed Care – PPO | Admitting: Family Medicine

## 2014-12-22 NOTE — Progress Notes (Addendum)
   Subjective:    Patient ID: Krista Tate, female    DOB: 11/23/1980, 34 y.o.   MRN: [id number]  HPI    Review of Systems     Objective:   Physical Exam        Assessment & Plan:  1. Depression Doing better; continue same med  2. Type 2 diabetes, uncontrolled, with neuropathy  - POCT glycosylated hemoglobin (Hb A1C)

## 2015-03-02 ENCOUNTER — Other Ambulatory Visit: Payer: Self-pay | Admitting: Family Medicine

## 2015-03-15 ENCOUNTER — Encounter (INDEPENDENT_AMBULATORY_CARE_PROVIDER_SITE_OTHER): Payer: Self-pay

## 2015-03-15 ENCOUNTER — Encounter: Payer: Self-pay | Admitting: Family Medicine

## 2015-03-15 ENCOUNTER — Ambulatory Visit (INDEPENDENT_AMBULATORY_CARE_PROVIDER_SITE_OTHER): Payer: BLUE CROSS/BLUE SHIELD | Admitting: Family Medicine

## 2015-03-15 VITALS — BP 115/72 | HR 81 | Temp 97.4°F | Ht 68.0 in | Wt 205.8 lb

## 2015-03-15 DIAGNOSIS — E785 Hyperlipidemia, unspecified: Secondary | ICD-10-CM

## 2015-03-15 DIAGNOSIS — E1169 Type 2 diabetes mellitus with other specified complication: Secondary | ICD-10-CM

## 2015-03-15 DIAGNOSIS — E1140 Type 2 diabetes mellitus with diabetic neuropathy, unspecified: Secondary | ICD-10-CM | POA: Diagnosis not present

## 2015-03-15 DIAGNOSIS — E1165 Type 2 diabetes mellitus with hyperglycemia: Secondary | ICD-10-CM

## 2015-03-15 DIAGNOSIS — IMO0002 Reserved for concepts with insufficient information to code with codable children: Secondary | ICD-10-CM

## 2015-03-15 LAB — POCT CBC
Granulocyte percent: 60.3 %G (ref 37–80)
HCT, POC: 34.3 % — AB (ref 37.7–47.9)
HEMOGLOBIN: 10.8 g/dL — AB (ref 12.2–16.2)
Lymph, poc: 3.8 — AB (ref 0.6–3.4)
MCH, POC: 21.1 pg — AB (ref 27–31.2)
MCHC: 31.3 g/dL — AB (ref 31.8–35.4)
MCV: 67.5 fL — AB (ref 80–97)
MPV: 8.1 fL (ref 0–99.8)
POC GRANULOCYTE: 6.8 (ref 2–6.9)
POC LYMPH %: 33.9 % (ref 10–50)
Platelet Count, POC: 406 10*3/uL (ref 142–424)
RBC: 5.09 M/uL (ref 4.04–5.48)
RDW, POC: 16.3 %
WBC: 11.2 10*3/uL — AB (ref 4.6–10.2)

## 2015-03-15 LAB — POCT GLYCOSYLATED HEMOGLOBIN (HGB A1C): HEMOGLOBIN A1C: 7.8

## 2015-03-15 MED ORDER — BLOOD GLUCOSE MONITOR KIT: PACK | Status: DC

## 2015-03-15 MED ORDER — CANAGLIFLOZIN 300 MG PO TABS: 300.0000 mg | ORAL_TABLET | Freq: Every day | ORAL | Status: DC

## 2015-03-15 MED ORDER — CANAGLIFLOZIN 100 MG PO TABS: 100.0000 mg | ORAL_TABLET | Freq: Every day | ORAL | Status: DC

## 2015-03-15 NOTE — Progress Notes (Signed)
Subjective:  Patient ID: Krista Tate, female    DOB: August 13, 1981  Age: 34 y.o. MRN: [id number]  CC: Diabetes; Hyperlipidemia; and Peripheral Neuropathy   HPI Krista Tate presents forFollow-up of diabetes. Patient does not check blood sugar at home Patient denies symptoms such as polyuria, polydipsia, excessive hunger, nausea No significant hypoglycemic spells noted. Medications as noted below. Taking them regularly without complication/adverse reaction being reported today.   Patient in for follow-up of elevated cholesterol. Doing well without complaints on current medication. Denies side effects of statin including myalgia and arthralgia and nausea. Also in today for liver function testing. Currently no chest pain, shortness of breath or other cardiovascular related symptoms noted.  Gabapentin. To be keeping her pain from the neuropathy under control. She reports left ankle pain related to this.  History Krista Tate has a past medical history of Diabetes mellitus without complication; Hyperlipidemia; and Depression.   She has no past surgical history on file.   Her family history includes Bipolar disorder in her sister; Healthy in her brother, father, mother, sister, and sister.She reports that she has never smoked. She has never used smokeless tobacco. She reports that she does not drink alcohol or use illicit drugs.  Current Outpatient Prescriptions on File Prior to Visit  Medication Sig Dispense Refill  . atorvastatin (LIPITOR) 20 MG tablet Take 1 tablet (20 mg total) by mouth daily. 90 tablet 0  . citalopram (CELEXA) 20 MG tablet TAKE ONE TABLET BY MOUTH ONCE DAILY 30 tablet 0  . gabapentin (NEURONTIN) 300 MG capsule Take 2 capsules in the AM Take 1 capsule at lunch Take 2 capsules at bedtime 150 capsule 3  . metFORMIN (GLUCOPHAGE) 500 MG tablet Take 1 tablet (500 mg total) by mouth 2 (two) times daily with a meal. 180 tablet 0   No current facility-administered medications on  file prior to visit.    ROS Review of Systems  Constitutional: Negative for fever, chills, diaphoresis, appetite change, fatigue and unexpected weight change.  HENT: Negative for congestion, ear pain, hearing loss, postnasal drip, rhinorrhea, sneezing, sore throat and trouble swallowing.   Eyes: Negative for pain.  Respiratory: Negative for cough, chest tightness and shortness of breath.   Cardiovascular: Negative for chest pain and palpitations.  Gastrointestinal: Negative for nausea, vomiting, abdominal pain, diarrhea and constipation.  Genitourinary: Negative for dysuria, frequency and menstrual problem.  Musculoskeletal: Negative for joint swelling and arthralgias.  Skin: Negative for rash.  Neurological: Negative for dizziness, weakness, numbness and headaches.  Psychiatric/Behavioral: Negative for dysphoric mood and agitation.    Objective:  BP 115/72 mmHg  Pulse 81  Temp(Src) 97.4 F (36.3 C) (Oral)  Ht _0  (1.727 m)  Wt 205 lb 12.8 oz (93.35 kg)  BMI 31.30 kg/m2  LMP 02/09/2015  BP Readings from Last 3 Encounters:  03/15/15 115/72  12/13/14 122/75  10/12/14 123/79    Wt Readings from Last 3 Encounters:  03/15/15 205 lb 12.8 oz (93.35 kg)  12/13/14 200 lb (90.719 kg)  10/12/14 200 lb (90.719 kg)     Physical Exam  Constitutional: She is oriented to person, place, and time. She appears well-developed and well-nourished. No distress.  HENT:  Head: Normocephalic and atraumatic.  Right Ear: External ear normal.  Left Ear: External ear normal.  Nose: Nose normal.  Mouth/Throat: Oropharynx is clear and moist.  Eyes: Conjunctivae and EOM are normal. Pupils are equal, round, and reactive to light.  Neck: Normal range of motion. Neck supple. No thyromegaly present.  Cardiovascular: Normal rate, regular rhythm and normal heart sounds.   No murmur heard. Pulmonary/Chest: Effort normal and breath sounds normal. No respiratory distress. She has no wheezes. She has no  rales.  Abdominal: Soft. Bowel sounds are normal. She exhibits no distension. There is no tenderness.  Lymphadenopathy:    She has no cervical adenopathy.  Neurological: She is alert and oriented to person, place, and time. She has normal reflexes.  Skin: Skin is warm and dry.  Psychiatric: She has a normal mood and affect. Her behavior is normal. Judgment and thought content normal.    Lab Results  Component Value Date   HGBA1C 7.8 03/15/2015   HGBA1C 7.2 12/13/2014   HGBA1C 8.2 09/01/2014    Lab Results  Component Value Date   WBC 11.2* 03/15/2015   HGB 10.8* 03/15/2015   HCT 34.3* 03/15/2015   GLUCOSE 233* 09/01/2014   ALT 12 09/01/2014   AST 11 09/01/2014   NA 136 09/01/2014   K 4.8 09/01/2014   CL 97 09/01/2014   CREATININE 0.76 09/01/2014   BUN 14 09/01/2014   CO2 23 09/01/2014   HGBA1C 7.8 03/15/2015    Patient was never admitted.  Assessment & Plan:   Krista Tate was seen today for diabetes, hyperlipidemia and peripheral neuropathy.  Diagnoses and all orders for this visit:  Type 2 diabetes, uncontrolled, with neuropathy Orders: -     POCT glycosylated hemoglobin (Hb A1C); Standing -     CMP14+EGFR; Standing -     POCT CBC; Standing -     POCT glycosylated hemoglobin (Hb A1C) -     CMP14+EGFR -     POCT CBC  Dyslipidemia associated with type 2 diabetes mellitus Orders: -     NMR, lipoprofile -     POCT CBC; Standing -     POCT CBC  Other orders -     blood glucose meter kit and supplies KIT; Dispense based on patient and insurance preference. Use up to four times daily as directed. (FOR ICD-9 250.00, 250.01). -     Discontinue: canagliflozin (INVOKANA) 100 MG TABS tablet; Take 1 tablet (100 mg total) by mouth daily. -     canagliflozin (INVOKANA) 300 MG TABS tablet; Take 300 mg by mouth daily before breakfast.   I have discontinued Krista Tate's glucose blood, ONETOUCH DELICA LANCETS 88F, neomycin-polymyxin-hydrocortisone, canagliflozin, and  canagliflozin. I am also having her start on blood glucose meter kit and supplies and canagliflozin. Additionally, I am having her maintain her gabapentin, atorvastatin, metFORMIN, and citalopram.  Meds ordered this encounter  Medications  . blood glucose meter kit and supplies KIT    Sig: Dispense based on patient and insurance preference. Use up to four times daily as directed. (FOR ICD-9 250.00, 250.01).    Dispense:  1 each    Refill:  0    Order Specific Question:  Number of strips    Answer:  100    Order Specific Question:  Number of lancets    Answer:  100  . DISCONTD: canagliflozin (INVOKANA) 100 MG TABS tablet    Sig: Take 1 tablet (100 mg total) by mouth daily.    Dispense:  30 tablet    Refill:  2  . canagliflozin (INVOKANA) 300 MG TABS tablet    Sig: Take 300 mg by mouth daily before breakfast.    Dispense:  30 tablet    Refill:  5     Follow-up: Return in about 3 months (around 06/15/2015) for  CPE, diabetes.  Claretta Fraise, M.D.

## 2015-03-15 NOTE — Patient Instructions (Signed)
Purchase an ASO brace for the left ankle.

## 2015-03-16 LAB — NMR, LIPOPROFILE
Cholesterol: 183 mg/dL (ref 100–199)
HDL Cholesterol by NMR: 36 mg/dL — ABNORMAL LOW (ref >39)
HDL Particle Number: 25.8 umol/L — ABNORMAL LOW (ref >=30.5)
LDL PARTICLE NUMBER: 1689 nmol/L — AB (ref <1000)
LDL Size: 19.7 nm (ref >20.5)
LDL-C: 97 mg/dL (ref 0–99)
LP-IR SCORE: 86 — AB (ref <=45)
Small LDL Particle Number: 1341 nmol/L — ABNORMAL HIGH (ref <=527)
Triglycerides by NMR: 248 mg/dL — ABNORMAL HIGH (ref 0–149)

## 2015-03-16 LAB — CMP14+EGFR
ALBUMIN: 3.8 g/dL (ref 3.5–5.5)
ALT: 15 IU/L (ref 0–32)
AST: 13 IU/L (ref 0–40)
Albumin/Globulin Ratio: 1.3 (ref 1.1–2.5)
Alkaline Phosphatase: 62 IU/L (ref 39–117)
BUN/Creatinine Ratio: 24 — ABNORMAL HIGH (ref 8–20)
BUN: 14 mg/dL (ref 6–20)
Bilirubin Total: 0.2 mg/dL (ref 0.0–1.2)
CO2: 24 mmol/L (ref 18–29)
CREATININE: 0.59 mg/dL (ref 0.57–1.00)
Calcium: 9.5 mg/dL (ref 8.7–10.2)
Chloride: 99 mmol/L (ref 97–108)
GFR calc Af Amer: 138 mL/min/{1.73_m2} (ref >59)
GFR calc non Af Amer: 120 mL/min/{1.73_m2} (ref >59)
GLUCOSE: 148 mg/dL — AB (ref 65–99)
Globulin, Total: 3 g/dL (ref 1.5–4.5)
Potassium: 4.7 mmol/L (ref 3.5–5.2)
Sodium: 138 mmol/L (ref 134–144)
TOTAL PROTEIN: 6.8 g/dL (ref 6.0–8.5)

## 2015-03-17 ENCOUNTER — Other Ambulatory Visit: Payer: Self-pay | Admitting: Family Medicine

## 2015-03-17 DIAGNOSIS — E785 Hyperlipidemia, unspecified: Principal | ICD-10-CM

## 2015-03-17 DIAGNOSIS — E1169 Type 2 diabetes mellitus with other specified complication: Secondary | ICD-10-CM

## 2015-03-17 MED ORDER — FERROUS FUMARATE 324 (106 FE) MG PO TABS: 324.0000 mg | ORAL_TABLET | Freq: Two times a day (BID) | ORAL | Status: DC

## 2015-03-17 MED ORDER — ATORVASTATIN CALCIUM 40 MG PO TABS: 40.0000 mg | ORAL_TABLET | Freq: Every day | ORAL | Status: DC

## 2015-03-20 LAB — ANEMIA PROFILE B
Ferritin: 8 ng/mL — ABNORMAL LOW (ref 15–150)
Folate: 8.7 ng/mL (ref >3.0)
IRON SATURATION: 5 % — AB (ref 15–55)
Iron: 20 ug/dL — ABNORMAL LOW (ref 27–159)
TIBC: 411 ug/dL (ref 250–450)
UIBC: 391 ug/dL (ref 131–425)
Vitamin B-12: 307 pg/mL (ref 211–946)

## 2015-03-20 LAB — SPECIMEN STATUS REPORT

## 2015-03-21 ENCOUNTER — Telehealth: Payer: Self-pay | Admitting: *Deleted

## 2015-03-21 NOTE — Telephone Encounter (Signed)
-----   Message from Mechele ClaudeWarren Stacks, MD sent at 03/17/2015  8:11 PM EDT ----- Your cholesterol is too high. It would benefit from medication. Without treatment, risk for heart attack, stroke and other medical conditions is high. I recommend atorvastatin. 40 mg daily with supper should reduce your risk significantly. You also have a moderate iron deficiency anemia. I recommend ferrous fumarate 106 mg twice daily. let's recheck it at your next visit.

## 2015-03-21 NOTE — Telephone Encounter (Signed)
Attempted to call pt in regards to test results.

## 2015-03-23 ENCOUNTER — Encounter: Payer: Self-pay | Admitting: *Deleted

## 2015-04-11 ENCOUNTER — Other Ambulatory Visit: Payer: Self-pay | Admitting: Family Medicine

## 2015-04-14 ENCOUNTER — Encounter: Payer: Self-pay | Admitting: *Deleted

## 2015-05-13 ENCOUNTER — Other Ambulatory Visit: Payer: Self-pay | Admitting: Family Medicine

## 2015-05-24 ENCOUNTER — Telehealth: Payer: Self-pay | Admitting: Family Medicine

## 2015-05-24 NOTE — Telephone Encounter (Signed)
Pt aware that [facility] was called and corrected

## 2015-06-20 ENCOUNTER — Encounter: Payer: BLUE CROSS/BLUE SHIELD | Admitting: Family Medicine

## 2015-06-22 ENCOUNTER — Encounter: Payer: BLUE CROSS/BLUE SHIELD | Admitting: Family Medicine

## 2015-07-11 ENCOUNTER — Encounter: Payer: Self-pay | Admitting: Family Medicine

## 2015-07-11 ENCOUNTER — Ambulatory Visit (INDEPENDENT_AMBULATORY_CARE_PROVIDER_SITE_OTHER): Payer: BLUE CROSS/BLUE SHIELD | Admitting: Family Medicine

## 2015-07-11 ENCOUNTER — Encounter (INDEPENDENT_AMBULATORY_CARE_PROVIDER_SITE_OTHER): Payer: Self-pay

## 2015-07-11 ENCOUNTER — Telehealth: Payer: Self-pay | Admitting: Family Medicine

## 2015-07-11 VITALS — BP 110/72 | HR 85 | Temp 97.2°F | Ht 66.0 in | Wt 203.6 lb

## 2015-07-11 DIAGNOSIS — E039 Hypothyroidism, unspecified: Secondary | ICD-10-CM | POA: Insufficient documentation

## 2015-07-11 DIAGNOSIS — R739 Hyperglycemia, unspecified: Secondary | ICD-10-CM

## 2015-07-11 DIAGNOSIS — Z01419 Encounter for gynecological examination (general) (routine) without abnormal findings: Secondary | ICD-10-CM | POA: Diagnosis not present

## 2015-07-11 DIAGNOSIS — E1169 Type 2 diabetes mellitus with other specified complication: Secondary | ICD-10-CM | POA: Diagnosis not present

## 2015-07-11 DIAGNOSIS — E1165 Type 2 diabetes mellitus with hyperglycemia: Secondary | ICD-10-CM

## 2015-07-11 DIAGNOSIS — E785 Hyperlipidemia, unspecified: Secondary | ICD-10-CM

## 2015-07-11 DIAGNOSIS — N898 Other specified noninflammatory disorders of vagina: Secondary | ICD-10-CM | POA: Diagnosis not present

## 2015-07-11 DIAGNOSIS — D509 Iron deficiency anemia, unspecified: Secondary | ICD-10-CM

## 2015-07-11 DIAGNOSIS — Z0000 Encounter for general adult medical examination without abnormal findings: Secondary | ICD-10-CM

## 2015-07-11 DIAGNOSIS — E1140 Type 2 diabetes mellitus with diabetic neuropathy, unspecified: Secondary | ICD-10-CM | POA: Diagnosis not present

## 2015-07-11 DIAGNOSIS — IMO0002 Reserved for concepts with insufficient information to code with codable children: Secondary | ICD-10-CM

## 2015-07-11 LAB — POCT WET PREP (WET MOUNT)

## 2015-07-11 MED ORDER — FLUCONAZOLE 150 MG PO TABS: 150.0000 mg | ORAL_TABLET | Freq: Once | ORAL | Status: DC

## 2015-07-11 MED ORDER — LEVOTHYROXINE SODIUM 50 MCG PO TABS: 25.0000 ug | ORAL_TABLET | Freq: Every day | ORAL | Status: DC

## 2015-07-11 MED ORDER — CYCLOBENZAPRINE HCL 10 MG PO TABS: ORAL_TABLET | ORAL | Status: DC

## 2015-07-11 MED ORDER — METFORMIN HCL ER 750 MG PO TB24: 1500.0000 mg | ORAL_TABLET | Freq: Every day | ORAL | Status: DC

## 2015-07-11 NOTE — Patient Instructions (Signed)
Calorie Counting for Weight Loss Calories are energy you get from the things you eat and drink. Your body uses this energy to keep you going throughout the day. The number of calories you eat affects your weight. When you eat more calories than your body needs, your body stores the extra calories as fat. When you eat fewer calories than your body needs, your body burns fat to get the energy it needs. Calorie counting means keeping track of how many calories you eat and drink each day. If you make sure to eat fewer calories than your body needs, you should lose weight. In order for calorie counting to work, you will need to eat the number of calories that are right for you in a day to lose a healthy amount of weight per week. A healthy amount of weight to lose per week is usually 1-2 lb (0.5-0.9 kg). A dietitian can determine how many calories you need in a day and give you suggestions on how to reach your calorie goal.  WHAT IS MY MY PLAN? My goal is to have __________ calories per day.  If I have this many calories per day, I should lose around __________ pounds per week. WHAT DO I NEED TO KNOW ABOUT CALORIE COUNTING? In order to meet your daily calorie goal, you will need to:  Find out how many calories are in each food you would like to eat. Try to do this before you eat.  Decide how much of the food you can eat.  Write down what you ate and how many calories it had. Doing this is called keeping a food log. WHERE DO I FIND CALORIE INFORMATION? The number of calories in a food can be found on a Nutrition Facts label. Note that all the information on a label is based on a specific serving of the food. If a food does not have a Nutrition Facts label, try to look up the calories online or ask your dietitian for help. HOW DO I DECIDE HOW MUCH TO EAT? To decide how much of the food you can eat, you will need to consider both the number of calories in one serving and the size of one serving. This  information can be found on the Nutrition Facts label. If a food does not have a Nutrition Facts label, look up the information online or ask your dietitian for help. Remember that calories are listed per serving. If you choose to have more than one serving of a food, you will have to multiply the calories per serving by the amount of servings you plan to eat. For example, the label on a package of bread might say that a serving size is 1 slice and that there are 90 calories in a serving. If you eat 1 slice, you will have eaten 90 calories. If you eat 2 slices, you will have eaten 180 calories. HOW DO I KEEP A FOOD LOG? After each meal, record the following information in your food log:  What you ate.  How much of it you ate.  How many calories it had.  Then, add up your calories. Keep your food log near you, such as in a small notebook in your pocket. Another option is to use a mobile app or website. Some programs will calculate calories for you and show you how many calories you have left each time you add an item to the log. WHAT ARE SOME CALORIE COUNTING TIPS?  Use your calories on foods   and drinks that will fill you up and not leave you hungry. Some examples of this include foods like nuts and nut butters, vegetables, lean proteins, and high-fiber foods (more than 5 g fiber per serving).  Eat nutritious foods and avoid empty calories. Empty calories are calories you get from foods or beverages that do not have many nutrients, such as candy and soda. It is better to have a nutritious high-calorie food (such as an avocado) than a food with few nutrients (such as a bag of chips).  Know how many calories are in the foods you eat most often. This way, you do not have to look up how many calories they have each time you eat them.  Look out for foods that may seem like low-calorie foods but are really high-calorie foods, such as baked goods, soda, and fat-free candy.  Pay attention to calories  in drinks. Drinks such as sodas, specialty coffee drinks, alcohol, and juices have a lot of calories yet do not fill you up. Choose low-calorie drinks like water and diet drinks.  Focus your calorie counting efforts on higher calorie items. Logging the calories in a garden salad that contains only vegetables is less important than calculating the calories in a milk shake.  Find a way of tracking calories that works for you. Get creative. Most people who are successful find ways to keep track of how much they eat in a day, even if they do not count every calorie. WHAT ARE SOME PORTION CONTROL TIPS?  Know how many calories are in a serving. This will help you know how many servings of a certain food you can have.  Use a measuring cup to measure serving sizes. This is helpful when you start out. With time, you will be able to estimate serving sizes for some foods.  Take some time to put servings of different foods on your favorite plates, bowls, and cups so you know what a serving looks like.  Try not to eat straight from a bag or box. Doing this can lead to overeating. Put the amount you would like to eat in a cup or on a plate to make sure you are eating the right portion.  Use smaller plates, glasses, and bowls to prevent overeating. This is a quick and easy way to practice portion control. If your plate is smaller, less food can fit on it.  Try not to multitask while eating, such as watching TV or using your computer. If it is time to eat, sit down at a table and enjoy your food. Doing this will help you to start recognizing when you are full. It will also make you more aware of what and how much you are eating. HOW CAN I CALORIE COUNT WHEN EATING OUT?  Ask for smaller portion sizes or child-sized portions.  Consider sharing an entree and sides instead of getting your own entree.  If you get your own entree, eat only half. Ask for a box at the beginning of your meal and put the rest of your  entree in it so you are not tempted to eat it.  Look for the calories on the menu. If calories are listed, choose the lower calorie options.  Choose dishes that include vegetables, fruits, whole grains, low-fat dairy products, and lean protein. Focusing on smart food choices from each of the 5 food groups can help you stay on track at restaurants.  Choose items that are boiled, broiled, grilled, or steamed.  Choose   water, milk, unsweetened iced tea, or other drinks without added sugars. If you want an alcoholic beverage, choose a lower calorie option. For example, a regular margarita can have up to 700 calories and a glass of wine has around 150.  Stay away from items that are buttered, battered, fried, or served with cream sauce. Items labeled "crispy" are usually fried, unless stated otherwise.  Ask for dressings, sauces, and syrups on the side. These are usually very high in calories, so do not eat much of them.  Watch out for salads. Many people think salads are a healthy option, but this is often not the case. Many salads come with bacon, fried chicken, lots of cheese, fried chips, and dressing. All of these items have a lot of calories. If you want a salad, choose a garden salad and ask for grilled meats or steak. Ask for the dressing on the side, or ask for olive oil and vinegar or lemon to use as dressing.  Estimate how many servings of a food you are given. For example, a serving of cooked rice is  cup or about the size of half a tennis ball or one cupcake wrapper. Knowing serving sizes will help you be aware of how much food you are eating at restaurants. The list below tells you how big or small some common portion sizes are based on everyday objects.  1 oz--4 stacked dice.  3 oz--1 deck of cards.  1 tsp--1 dice.  1 Tbsp-- a Ping-Pong ball.  2 Tbsp--1 Ping-Pong ball.   cup--1 tennis ball or 1 cupcake wrapper.  1 cup--1 baseball. Document Released: 10/21/2005 Document  Revised: 03/07/2014 Document Reviewed: 08/26/2013 [facility] Patient Information 2015 [place], [place]. This information is not intended to replace advice given to you by your health care provider. Make sure you discuss any questions you have with your health care provider. Diabetes and Exercise Exercising regularly is important. It is not just about losing weight. It has many health benefits, such as:  Improving your overall fitness, flexibility, and endurance.  Increasing your bone density.  Helping with weight control.  Decreasing your body fat.  Increasing your muscle strength.  Reducing stress and tension.  Improving your overall health. People with diabetes who exercise gain additional benefits because exercise:  Reduces appetite.  Improves the body's use of blood sugar (glucose).  Helps lower or control blood glucose.  Decreases blood pressure.  Helps control blood lipids (such as cholesterol and triglycerides).  Improves the body's use of the hormone insulin by:  Increasing the body's insulin sensitivity.  Reducing the body's insulin needs.  Decreases the risk for heart disease because exercising:  Lowers cholesterol and triglycerides levels.  Increases the levels of good cholesterol (such as high-density lipoproteins [HDL]) in the body.  Lowers blood glucose levels. YOUR ACTIVITY PLAN  Choose an activity that you enjoy and set realistic goals. Your health care provider or diabetes educator can help you make an activity plan that works for you. Exercise regularly as directed by your health care provider. This includes:  Performing resistance training twice a week such as push-ups, sit-ups, lifting weights, or using resistance bands.  Performing 150 minutes of cardio exercises each week such as walking, running, or playing sports.  Staying active and spending no more than 90 minutes at one time being inactive. Even short bursts of exercise are good for you.  Three 10-minute sessions spread throughout the day are just as beneficial as a single 30-minute session. Some exercise ideas  include:  Taking the dog for a walk.  Taking the stairs instead of the elevator.  Dancing to your favorite song.  Doing an exercise video.  Doing your favorite exercise with a friend. RECOMMENDATIONS FOR EXERCISING WITH TYPE 1 OR TYPE 2 DIABETES   Check your blood glucose before exercising. If blood glucose levels are greater than 240 mg/dL, check for urine ketones. Do not exercise if ketones are present.  Avoid injecting insulin into areas of the body that are going to be exercised. For example, avoid injecting insulin into:  The arms when playing tennis.  The legs when jogging.  Keep a record of:  Food intake before and after you exercise.  Expected peak times of insulin action.  Blood glucose levels before and after you exercise.  The type and amount of exercise you have done.  Review your records with your health care provider. Your health care provider will help you to develop guidelines for adjusting food intake and insulin amounts before and after exercising.  If you take insulin or oral hypoglycemic agents, watch for signs and symptoms of hypoglycemia. They include:  Dizziness.  Shaking.  Sweating.  Chills.  Confusion.  Drink plenty of water while you exercise to prevent dehydration or heat stroke. Body water is lost during exercise and must be replaced.  Talk to your health care provider before starting an exercise program to make sure it is safe for you. Remember, almost any type of activity is better than none. Document Released: 01/11/2004 Document Revised: 03/07/2014 Document Reviewed: 03/30/2013 [facility] Patient Information 2015 [place], [place]. This information is not intended to replace advice given to you by your health care provider. Make sure you discuss any questions you have with your health care provider.

## 2015-07-11 NOTE — Addendum Note (Signed)
Addended by: Prescott Gum on: 07/11/2015 06:02 PM   Modules accepted: Orders, SmartSet

## 2015-07-11 NOTE — Progress Notes (Signed)
Subjective:  Patient ID: Krista Tate, female    DOB: 1981-10-27  Age: 34 y.o. MRN: [id number]  CC: Gynecologic Exam   HPI Krista Tate presents for  follow-up of hypertension. Patient has no history of headache chest pain or shortness of breath or recent cough. Patient also denies symptoms of TIA such as numbness weakness lateralizing. Patient denies side effects from his medication. States taking it regularly.  Patient also  in for follow-up of elevated cholesterol. Doing well without complaints on current medication. Denies side effects of statin including myalgia and arthralgia and nausea. Lab results are appended. They were done by her employer. They show her HDL to be slightly low. Her LDL is appropriate and her total cholesterol was excellent.. Currently no chest pain, shortness of breath or other cardiovascular related symptoms noted.  Follow-up of diabetes. No glucose readings returned today. States she has not been exercising or following a close diet recently. Patient denies symptoms such as polyuria, polydipsia, excessive hunger, nausea No significant hypoglycemic spells noted. She does have a history of neuropathy. At this point the pain is well controlled on gabapentin. Medications as noted below. Taking them regularly without complication/adverse reaction being reported today. Patient had a hemoglobin A1c performed at her work also. This came back 7.7.  Patient also reports significant headache pain intermittently. She has taken Flexeril with some relief for that. Her right shoulder is bothering her as well. She tells me that it is moderate pain but it is increasing and continual for the last several weeks to months. 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   Patient presents for follow-up on  thyroid. She was diagnosed through blood work done last week. She has now been on 0.25 g of levothyroxine for 6 days. This was ordered by the nurse  practitioner at her employer's medical clinic. Pt. denies any change in  voice, loss of hair, heat or cold intolerance. Energy level has been adequate to good. She denies constipation and diarrhea. No myxedema. Verified that pt is taking the medication daily on an empty stomach. Well tolerated.  Patient is also in today for Pap exam. History [name] has a past medical history of Diabetes mellitus without complication; Hyperlipidemia; Depression; and Thyroid disease.   She has no past surgical history on file.   Her family history includes Bipolar disorder in her sister; Healthy in her brother, father, mother, sister, and sister.She reports that she has never smoked. She has never used smokeless tobacco. She reports that she does not drink alcohol or use illicit drugs.  Current Outpatient Prescriptions on File Prior to Visit  Medication Sig Dispense Refill  . atorvastatin (LIPITOR) 20 MG tablet TAKE ONE TABLET BY MOUTH ONCE DAILY 30 tablet 3  . blood glucose meter kit and supplies KIT Dispense based on patient and insurance preference. Use up to four times daily as directed. (FOR ICD-9 250.00, 250.01). 1 each 0  . canagliflozin (INVOKANA) 300 MG TABS tablet Take 300 mg by mouth daily before breakfast. 30 tablet 5  . citalopram (CELEXA) 20 MG tablet TAKE ONE TABLET BY MOUTH ONCE DAILY 30 tablet 2  . Ferrous Fumarate 324 (106 FE) MG TABS Take 324 mg by mouth 2 (two) times daily. 60 tablet 5  . gabapentin (NEURONTIN) 300 MG capsule TAKE TWO CAPSULES BY MOUTH IN THE MORNING AND ONE CAPSULE AT LUNCH AND TWO CAPSULES AT BEDTIME 150 capsule 2   No current facility-administered medications  on file prior to visit.    ROS Review of Systems  Constitutional: Negative for fever, chills, diaphoresis, appetite change, fatigue and unexpected weight change.  HENT: Negative for congestion, ear pain, hearing loss, postnasal drip, rhinorrhea, sneezing, sore throat and trouble swallowing.   Eyes: Negative for  pain.  Respiratory: Negative for cough, chest tightness and shortness of breath.   Cardiovascular: Negative for chest pain and palpitations.  Gastrointestinal: Negative for nausea, vomiting, abdominal pain, diarrhea and constipation.  Genitourinary: Negative for dysuria, frequency and menstrual problem.  Musculoskeletal: Positive for arthralgias and neck pain. Negative for joint swelling.  Skin: Negative for rash.  Neurological: Negative for dizziness, weakness, numbness and headaches.  Psychiatric/Behavioral: Negative for dysphoric mood and agitation.    Objective:  BP 110/72 mmHg  Pulse 85  Temp(Src) 97.2 F (36.2 C) (Oral)  Ht 5' 6" (1.676 m)  Wt 203 lb 9.6 oz (92.352 kg)  BMI 32.88 kg/m2  BP Readings from Last 3 Encounters:  07/11/15 110/72  03/15/15 115/72  12/13/14 122/75    Wt Readings from Last 3 Encounters:  07/11/15 203 lb 9.6 oz (92.352 kg)  03/15/15 205 lb 12.8 oz (93.35 kg)  12/13/14 200 lb (90.719 kg)     Physical Exam  Constitutional: She is oriented to person, place, and time. She appears well-developed and well-nourished. No distress.  HENT:  Head: Normocephalic and atraumatic.  Right Ear: External ear normal.  Left Ear: External ear normal.  Nose: Nose normal.  Mouth/Throat: Oropharynx is clear and moist.  Eyes: Conjunctivae and EOM are normal. Pupils are equal, round, and reactive to light.  Neck: Normal range of motion. Neck supple. No thyromegaly present.  Cardiovascular: Normal rate, regular rhythm and normal heart sounds.   No murmur heard. Pulmonary/Chest: Effort normal and breath sounds normal. No respiratory distress. She has no wheezes. She has no rales.  Abdominal: Soft. Bowel sounds are normal. She exhibits no distension. There is no tenderness.  Genitourinary: Uterus normal. Vaginal discharge (scant slightly curdy) found.  Lymphadenopathy:    She has no cervical adenopathy.  Neurological: She is alert and oriented to person, place, and  time. She has normal reflexes.  Skin: Skin is warm and dry.  Psychiatric: She has a normal mood and affect. Her behavior is normal. Judgment and thought content normal.    Lab Results  Component Value Date   HGBA1C 7.8 03/15/2015   HGBA1C 7.2 12/13/2014   HGBA1C 8.2 09/01/2014    Lab Results  Component Value Date   WBC 11.2* 03/15/2015   HGB 10.8* 03/15/2015   HCT 34.3* 03/15/2015   GLUCOSE 148* 03/15/2015   CHOL 183 03/15/2015   TRIG 248* 03/15/2015   HDL 36* 03/15/2015   ALT 15 03/15/2015   AST 13 03/15/2015   NA 138 03/15/2015   K 4.7 03/15/2015   CL 99 03/15/2015   CREATININE 0.59 03/15/2015   BUN 14 03/15/2015   CO2 24 03/15/2015   HGBA1C 7.8 03/15/2015    Patient was never admitted.  Assessment & Plan:   Dalya was seen today for gynecologic exam.  Diagnoses and all orders for this visit:  Anemia, iron deficiency  Type 2 diabetes, uncontrolled, with neuropathy -     POCT Wet [facility])  Dyslipidemia associated with type 2 diabetes mellitus  Wellness examination  Hyperglycemia  Hypothyroidism, unspecified hypothyroidism type  Other orders -     metFORMIN (GLUCOPHAGE-XR) 750 MG 24 hr tablet; Take 2 tablets (1,500 mg total) by mouth  daily with breakfast. -     levothyroxine (SYNTHROID, LEVOTHROID) 50 MCG tablet; Take 0.5 tablets (25 mcg total) by mouth daily before breakfast. -     fluconazole (DIFLUCAN) 150 MG tablet; Take 1 tablet (150 mg total) by mouth once. -     cyclobenzaprine (FLEXERIL) 10 MG tablet; One at bedtime for shoulder spasm. May take up to TID for headache   I have discontinued Ms. Brubeck's metFORMIN. I have also changed her levothyroxine. Additionally, I am having her start on metFORMIN, fluconazole, and cyclobenzaprine. Lastly, I am having her maintain her blood glucose meter kit and supplies, canagliflozin, Ferrous Fumarate, citalopram, gabapentin, and atorvastatin.  Meds ordered this encounter  Medications  . DISCONTD:  levothyroxine (SYNTHROID, LEVOTHROID) 25 MCG tablet    Sig: Take 25 mcg by mouth daily before breakfast.  . metFORMIN (GLUCOPHAGE-XR) 750 MG 24 hr tablet    Sig: Take 2 tablets (1,500 mg total) by mouth daily with breakfast.    Dispense:  60 tablet    Refill:  5  . levothyroxine (SYNTHROID, LEVOTHROID) 50 MCG tablet    Sig: Take 0.5 tablets (25 mcg total) by mouth daily before breakfast.    Dispense:  30 tablet    Refill:  1  . fluconazole (DIFLUCAN) 150 MG tablet    Sig: Take 1 tablet (150 mg total) by mouth once.    Dispense:  1 tablet    Refill:  0  . cyclobenzaprine (FLEXERIL) 10 MG tablet    Sig: One at bedtime for shoulder spasm. May take up to TID for headache    Dispense:  90 tablet    Refill:  5     Follow-up: Return in about 3 months (around 10/10/2015).  Claretta Fraise, M.D.

## 2015-07-11 NOTE — Telephone Encounter (Signed)
[facility] needs to clarify directions for the flexeril.  The prescription states one, once a day for shoulder spasms but also states may take up to three times a day for headache, with a quantity of 90.  They need to clarify that these directions are correct and they should include "one once a day for shoulder spasms AND one three times a day for headaches" on the bottle.

## 2015-07-14 LAB — PAP IG W/ RFLX HPV ASCU: PAP SMEAR COMMENT: 0

## 2015-07-17 ENCOUNTER — Telehealth: Payer: Self-pay | Admitting: *Deleted

## 2015-07-17 NOTE — Telephone Encounter (Signed)
-----   Message from Mechele Claude, MD sent at 07/16/2015  1:12 PM EDT ----- Krista Tate, Congratulations, your Pap smear was normal! Best Regards, Mechele Claude, M.D.

## 2015-07-18 ENCOUNTER — Other Ambulatory Visit: Payer: Self-pay | Admitting: Family Medicine

## 2015-07-20 ENCOUNTER — Other Ambulatory Visit: Payer: Self-pay | Admitting: Family Medicine

## 2015-09-29 ENCOUNTER — Other Ambulatory Visit: Payer: Self-pay | Admitting: Family Medicine

## 2015-10-11 ENCOUNTER — Other Ambulatory Visit: Payer: Self-pay | Admitting: Family Medicine

## 2015-10-11 ENCOUNTER — Ambulatory Visit: Payer: BLUE CROSS/BLUE SHIELD | Admitting: Family Medicine

## 2015-10-11 ENCOUNTER — Telehealth: Payer: Self-pay | Admitting: Family Medicine

## 2015-10-11 MED ORDER — METFORMIN HCL 500 MG PO TABS: 500.0000 mg | ORAL_TABLET | Freq: Three times a day (TID) | ORAL | Status: DC

## 2015-10-12 NOTE — Telephone Encounter (Signed)
Last seen 07/11/15  Dr Darlyn ReadStacks  Last lipid 03/20/15

## 2015-10-13 ENCOUNTER — Other Ambulatory Visit: Payer: Self-pay

## 2015-10-13 ENCOUNTER — Other Ambulatory Visit: Payer: Self-pay | Admitting: Family Medicine

## 2015-10-13 MED ORDER — LEVOTHYROXINE SODIUM 50 MCG PO TABS: 25.0000 ug | ORAL_TABLET | Freq: Every day | ORAL | Status: DC

## 2015-10-13 MED ORDER — CITALOPRAM HYDROBROMIDE 20 MG PO TABS: 20.0000 mg | ORAL_TABLET | Freq: Every day | ORAL | Status: DC

## 2015-10-13 MED ORDER — CANAGLIFLOZIN 300 MG PO TABS: 300.0000 mg | ORAL_TABLET | Freq: Every day | ORAL | Status: DC

## 2015-10-13 NOTE — Telephone Encounter (Signed)
Last seen 07/11/15 Dr Darlyn ReadStacks  [facility]

## 2015-10-14 NOTE — Telephone Encounter (Signed)
Script for thyroid was sent to Mail order pharmacy per Dr. Darlyn ReadStacks.     It would take up to a week to receive this script with a quantity of 10 pills.   Message left for patient,  tried to cancel script but the Mail order person was  unsure if they could before shipping on Monday.  We can call a script in locally if patient calls us back to say which pharmacy preferred.

## 2015-10-18 NOTE — Progress Notes (Signed)
Detailed message left for patient.

## 2015-11-02 ENCOUNTER — Encounter: Payer: Self-pay | Admitting: Family Medicine

## 2015-11-02 ENCOUNTER — Ambulatory Visit (INDEPENDENT_AMBULATORY_CARE_PROVIDER_SITE_OTHER): Payer: BLUE CROSS/BLUE SHIELD | Admitting: Family Medicine

## 2015-11-02 VITALS — BP 117/84 | HR 108 | Temp 98.6°F | Ht 66.0 in | Wt 196.6 lb

## 2015-11-02 DIAGNOSIS — E1165 Type 2 diabetes mellitus with hyperglycemia: Secondary | ICD-10-CM | POA: Diagnosis not present

## 2015-11-02 DIAGNOSIS — IMO0002 Reserved for concepts with insufficient information to code with codable children: Secondary | ICD-10-CM

## 2015-11-02 DIAGNOSIS — E785 Hyperlipidemia, unspecified: Secondary | ICD-10-CM

## 2015-11-02 DIAGNOSIS — Z23 Encounter for immunization: Secondary | ICD-10-CM

## 2015-11-02 DIAGNOSIS — E039 Hypothyroidism, unspecified: Secondary | ICD-10-CM | POA: Diagnosis not present

## 2015-11-02 DIAGNOSIS — E1169 Type 2 diabetes mellitus with other specified complication: Secondary | ICD-10-CM

## 2015-11-02 DIAGNOSIS — D509 Iron deficiency anemia, unspecified: Secondary | ICD-10-CM

## 2015-11-02 LAB — POCT URINALYSIS DIPSTICK
Bilirubin, UA: NEGATIVE
Glucose, UA: NEGATIVE
Ketones, UA: NEGATIVE
LEUKOCYTES UA: NEGATIVE
NITRITE UA: NEGATIVE
PH UA: 5
PROTEIN UA: NEGATIVE
RBC UA: NEGATIVE
Spec Grav, UA: 1.02
UROBILINOGEN UA: NEGATIVE

## 2015-11-02 LAB — POCT GLYCOSYLATED HEMOGLOBIN (HGB A1C): HEMOGLOBIN A1C: 7.9

## 2015-11-02 MED ORDER — METFORMIN HCL 1000 MG PO TABS: 1000.0000 mg | ORAL_TABLET | Freq: Two times a day (BID) | ORAL | Status: DC

## 2015-11-02 NOTE — Progress Notes (Signed)
Quick Note:  Please contact the patient regarding: Hemoglobin A1c came back a little higher than anticipated today. I would like for you to increase the metformin dose to 1000 mg twice daily. You can do that as 2 500 mg tablets in the morning and in the evening. [facility] thousand milligram tablet 1 in the morning and in the evening. If this works out without causing excessive upset stomach or diarrhea, left me know and I will [facility] ______

## 2015-11-02 NOTE — Progress Notes (Signed)
Subjective:  Patient ID: Krista Tate, female    DOB: [birth year]  Age: 34 y.o. MRN: [id number]  CC: Diabetes; Hyperlipidemia; and Peripheral Neuropathy   HPI Krista Tate presents for  follow-up of hypertension. Patient has no history of headache chest pain or shortness of breath or recent cough. Patient also denies symptoms of TIA such as numbness weakness lateralizing. Patient checks  blood pressure at home and has not had any elevated readings recently. Patient denies side effects from his medication. States taking it regularly.  Patient also  in for follow-up of elevated cholesterol. Doing well without complaints on current medication. Denies side effects of statin including myalgia and arthralgia and nausea. Also in today for liver function testing. Currently no chest pain, shortness of breath or other cardiovascular related symptoms noted.  Follow-up of diabetes. Patient does check blood sugar at home. Readings run between 100 and 140 Patient denies symptoms such as polyuria, polydipsia, excessive hunger, nausea No significant hypoglycemic spells noted. Medications as noted below. Taking them regularly without complication/adverse reaction being reported today.    History Krista Tate has a past medical history of Diabetes mellitus without complication ([place]); Hyperlipidemia; Depression; and Thyroid disease.   She has no past surgical history on file.   Her family history includes Bipolar disorder in her sister; Healthy in her brother, father, mother, sister, and sister.She reports that she has never smoked. She has never used smokeless tobacco. She reports that she does not drink alcohol or use illicit drugs.  Current Outpatient Prescriptions on File Prior to Visit  Medication Sig Dispense Refill  . atorvastatin (LIPITOR) 20 MG tablet TAKE ONE TABLET BY MOUTH ONCE DAILY 30 tablet 0  . blood glucose meter kit and supplies KIT Dispense based on patient and insurance preference. Use up to  four times daily as directed. (FOR ICD-9 250.00, 250.01). 1 each 0  . canagliflozin (INVOKANA) 300 MG TABS tablet Take 300 mg by mouth daily before breakfast. 90 tablet 0  . citalopram (CELEXA) 20 MG tablet Take 1 tablet (20 mg total) by mouth daily. 90 tablet 0  . cyclobenzaprine (FLEXERIL) 10 MG tablet One at bedtime for shoulder spasm. May take up to TID for headache 90 tablet 5  . Lancets Misc. (RELION LANCING DEVICE) KIT USE AS DIRECTED TO CHECK GLUCOSE FOUR TIMES DAILY 1 kit 0  . levothyroxine (SYNTHROID, LEVOTHROID) 50 MCG tablet Take 0.5 tablets (25 mcg total) by mouth daily before breakfast. 10 tablet 0  . RELION PRIME TEST test strip USE ONE STRIP TO CHECK GLUCOSE 4 TIMES DAILY 100 each 2  . gabapentin (NEURONTIN) 300 MG capsule TAKE TWO CAPSULES BY MOUTH IN THE MORNING, ONE CAPSULE AT LUNCH AND TWO AT BEDTIME (Patient not taking: Reported on 11/02/2015) 150 capsule 0   No current facility-administered medications on file prior to visit.    ROS Review of Systems  Constitutional: Negative for fever, activity change and appetite change.  HENT: Negative for congestion, rhinorrhea and sore throat.   Eyes: Negative for visual disturbance.  Respiratory: Negative for cough and shortness of breath.   Cardiovascular: Negative for chest pain and palpitations.  Gastrointestinal: Negative for nausea, abdominal pain and diarrhea.  Genitourinary: Negative for dysuria.  Musculoskeletal: Negative for myalgias and arthralgias.    Objective:  BP 117/84 mmHg  Pulse 108  Temp(Src) 98.6 F (37 C) (Oral)  Ht '5\' 6"'$  (1.676 m)  Wt 196 lb 9.6 oz (89.177 kg)  BMI 31.75 kg/m2  SpO2 100%  LMP 10/27/2015  BP Readings from Last 3 Encounters:  11/02/15 117/84  07/11/15 110/72  03/15/15 115/72    Wt Readings from Last 3 Encounters:  11/02/15 196 lb 9.6 oz (89.177 kg)  07/11/15 203 lb 9.6 oz (92.352 kg)  03/15/15 205 lb 12.8 oz (93.35 kg)     Physical Exam  Constitutional: She is oriented to  person, place, and time. She appears well-developed and well-nourished. No distress.  HENT:  Head: Normocephalic and atraumatic.  Right Ear: External ear normal.  Left Ear: External ear normal.  Nose: Nose normal.  Mouth/Throat: Oropharynx is clear and moist.  Eyes: Conjunctivae and EOM are normal. Pupils are equal, round, and reactive to light.  Neck: Normal range of motion. Neck supple. No thyromegaly present.  Cardiovascular: Normal rate, regular rhythm and normal heart sounds.   No murmur heard. Pulmonary/Chest: Effort normal and breath sounds normal. No respiratory distress. She has no wheezes. She has no rales.  Abdominal: Soft. Bowel sounds are normal. She exhibits no distension. There is no tenderness.  Lymphadenopathy:    She has no cervical adenopathy.  Neurological: She is alert and oriented to person, place, and time. She has normal reflexes.  Skin: Skin is warm and dry.  Psychiatric: She has a normal mood and affect. Her behavior is normal. Judgment and thought content normal.    Lab Results  Component Value Date   HGBA1C 7.9% 11/02/2015   HGBA1C 7.8 03/15/2015   HGBA1C 7.2 12/13/2014    Lab Results  Component Value Date   WBC 11.2* 03/15/2015   HGB 10.8* 03/15/2015   HCT 34.3* 03/15/2015   GLUCOSE 148* 03/15/2015   CHOL 182 11/02/2015   TRIG 296* 11/02/2015   HDL 34* 11/02/2015   LDLCALC 89 11/02/2015   ALT 15 03/15/2015   AST 13 03/15/2015   NA 138 03/15/2015   K 4.7 03/15/2015   CL 99 03/15/2015   CREATININE 0.59 03/15/2015   BUN 14 03/15/2015   CO2 24 03/15/2015   HGBA1C 7.9% 11/02/2015    Patient was never admitted.  Assessment & Plan:   Krista Tate was seen today for diabetes, hyperlipidemia and peripheral neuropathy.  Diagnoses and all orders for this visit:  Uncontrolled type 2 diabetes mellitus with other specified complication, without long-term current use of insulin (HCC) -     POCT glycosylated hemoglobin (Hb A1C) -     Microalbumin /  creatinine urine ratio -     POCT urinalysis dipstick  Dyslipidemia associated with type 2 diabetes mellitus (HCC) -     Lipid panel -     POCT urinalysis dipstick  Hypothyroidism, unspecified hypothyroidism type  Anemia, iron deficiency -     Ferritin  Encounter for immunization  Other orders -     Flu Vaccine QUAD 36+ mos IM -     metFORMIN (GLUCOPHAGE) 1000 MG tablet; Take 1 tablet (1,000 mg total) by mouth 2 (two) times daily with a meal.   I have discontinued Ms. Heimann's Ferrous Fumarate and fluconazole. I have also changed her metFORMIN. Additionally, I am having her maintain her blood glucose meter kit and supplies, cyclobenzaprine, RELION LANCING DEVICE, RELION PRIME TEST, gabapentin, atorvastatin, canagliflozin, citalopram, and levothyroxine.  Meds ordered this encounter  Medications  . metFORMIN (GLUCOPHAGE) 1000 MG tablet    Sig: Take 1 tablet (1,000 mg total) by mouth 2 (two) times daily with a meal.    Dispense:  60 tablet    Refill:  2   Normalized BMI data available only  for age 8 to 22 years.   Follow-up: Return in about 3 months (around 01/31/2016) for diabetes.  Claretta Fraise, M.D.

## 2015-11-03 LAB — FERRITIN: Ferritin: 27 ng/mL (ref 15–150)

## 2015-11-03 LAB — LIPID PANEL
CHOL/HDL RATIO: 5.4 ratio — AB (ref 0.0–4.4)
Cholesterol, Total: 182 mg/dL (ref 100–199)
HDL: 34 mg/dL — AB (ref >39)
LDL Calculated: 89 mg/dL (ref 0–99)
Triglycerides: 296 mg/dL — ABNORMAL HIGH (ref 0–149)
VLDL CHOLESTEROL CAL: 59 mg/dL — AB (ref 5–40)

## 2015-11-03 LAB — MICROALBUMIN / CREATININE URINE RATIO
CREATININE, UR: 132.3 mg/dL
MICROALB/CREAT RATIO: 9.5 mg/g creat (ref 0.0–30.0)
MICROALBUM., U, RANDOM: 12.6 ug/mL

## 2016-01-17 ENCOUNTER — Other Ambulatory Visit: Payer: Self-pay | Admitting: Family Medicine

## 2016-01-17 MED ORDER — ATORVASTATIN CALCIUM 20 MG PO TABS: 20.0000 mg | ORAL_TABLET | Freq: Every day | ORAL | Status: DC

## 2016-01-17 MED ORDER — CITALOPRAM HYDROBROMIDE 20 MG PO TABS: 20.0000 mg | ORAL_TABLET | Freq: Every day | ORAL | Status: DC

## 2016-01-17 MED ORDER — CANAGLIFLOZIN 300 MG PO TABS: 300.0000 mg | ORAL_TABLET | Freq: Every day | ORAL | Status: DC

## 2016-01-17 NOTE — Telephone Encounter (Signed)
Left patient a voicemail notifying her that prescriptions were sent to [facility] in [place].

## 2016-01-18 ENCOUNTER — Other Ambulatory Visit: Payer: Self-pay | Admitting: *Deleted

## 2016-01-23 ENCOUNTER — Telehealth: Payer: Self-pay

## 2016-01-23 MED ORDER — METFORMIN HCL 1000 MG PO TABS: 1000.0000 mg | ORAL_TABLET | Freq: Two times a day (BID) | ORAL | Status: DC

## 2016-01-23 NOTE — Telephone Encounter (Signed)
rx refill

## 2016-02-02 ENCOUNTER — Encounter: Payer: Self-pay | Admitting: Family Medicine

## 2016-02-05 ENCOUNTER — Ambulatory Visit (INDEPENDENT_AMBULATORY_CARE_PROVIDER_SITE_OTHER): Payer: BLUE CROSS/BLUE SHIELD | Admitting: Family Medicine

## 2016-02-05 ENCOUNTER — Encounter: Payer: Self-pay | Admitting: Family Medicine

## 2016-02-05 ENCOUNTER — Encounter: Payer: Self-pay | Admitting: *Deleted

## 2016-02-05 VITALS — BP 111/75 | HR 78 | Temp 98.0°F | Ht 66.0 in | Wt 200.4 lb

## 2016-02-05 DIAGNOSIS — E785 Hyperlipidemia, unspecified: Secondary | ICD-10-CM | POA: Diagnosis not present

## 2016-02-05 DIAGNOSIS — E1165 Type 2 diabetes mellitus with hyperglycemia: Secondary | ICD-10-CM

## 2016-02-05 DIAGNOSIS — D509 Iron deficiency anemia, unspecified: Secondary | ICD-10-CM

## 2016-02-05 DIAGNOSIS — E1169 Type 2 diabetes mellitus with other specified complication: Secondary | ICD-10-CM | POA: Diagnosis not present

## 2016-02-05 DIAGNOSIS — E1140 Type 2 diabetes mellitus with diabetic neuropathy, unspecified: Secondary | ICD-10-CM | POA: Diagnosis not present

## 2016-02-05 DIAGNOSIS — IMO0002 Reserved for concepts with insufficient information to code with codable children: Secondary | ICD-10-CM

## 2016-02-05 DIAGNOSIS — E039 Hypothyroidism, unspecified: Secondary | ICD-10-CM

## 2016-02-05 MED ORDER — METFORMIN HCL 1000 MG PO TABS: 1000.0000 mg | ORAL_TABLET | Freq: Two times a day (BID) | ORAL | Status: DC

## 2016-02-05 MED ORDER — CITALOPRAM HYDROBROMIDE 20 MG PO TABS: 20.0000 mg | ORAL_TABLET | Freq: Every day | ORAL | Status: DC

## 2016-02-05 MED ORDER — ATORVASTATIN CALCIUM 20 MG PO TABS: 20.0000 mg | ORAL_TABLET | Freq: Every day | ORAL | Status: DC

## 2016-02-05 MED ORDER — GLUCOSE BLOOD VI STRP
ORAL_STRIP | Status: DC
Start: 2016-02-05 — End: 2017-01-01

## 2016-02-05 NOTE — Progress Notes (Signed)
Subjective:  Patient ID: Krista Tate, female    DOB: November 26, 1980  Age: 35 y.o. MRN: [id number]  CC: Diabetes; Hypothyroidism; and Hyperlipidemia   HPI Krista Tate presents for Follow-up of diabetes. Patient does not check blood sugar at home Patient denies symptoms such as polyuria, polydipsia, excessive hunger, nausea No significant hypoglycemic spells noted. She does have neuropathic symptoms. Medications as noted below. Taking them regularly without complication/adverse reaction being reported today.  Patient in for follow-up of elevated cholesterol. Doing well without complaints on current medication. Denies side effects of statin including myalgia and arthralgia and nausea. Also in today for liver function testing. Currently no chest pain, shortness of breath or other cardiovascular related symptoms noted. Of note is that her most recent level although good is not quite at goal for LDL and a diabetic. At 89, LDL should be 70 or less. Patient presents for follow-up on  thyroid. The patient has a history of hypothyroidism for many years. It has been stable recently. Pt. denies any change in  voice, loss of hair, heat or cold intolerance. Energy level has been adequate to good. Patient denies constipation and diarrhea. No myxedema. Medication is as noted below. Verified that pt is taking it daily on an empty stomach. Well tolerated.   History [name] has a past medical history of Diabetes mellitus without complication ([place]); Hyperlipidemia; Depression; and Thyroid disease.   She has no past surgical history on file.   Her family history includes Bipolar disorder in her sister; Healthy in her brother, father, mother, sister, and sister.She reports that she has never smoked. She has never used smokeless tobacco. She reports that she does not drink alcohol or use illicit drugs.    ROS Review of Systems  Constitutional: Negative for fever, activity change and appetite change.  HENT:  Negative for congestion, rhinorrhea and sore throat.   Eyes: Negative for visual disturbance.  Respiratory: Negative for cough and shortness of breath.   Cardiovascular: Negative for chest pain and palpitations.  Gastrointestinal: Negative for nausea, abdominal pain and diarrhea.  Genitourinary: Negative for dysuria.  Musculoskeletal: Negative for myalgias and arthralgias.    Objective:  BP 111/75 mmHg  Pulse 78  Temp(Src) 98 F (36.7 C) (Oral)  Ht _0  (1.676 m)  Wt 200 lb 6.4 oz (90.901 kg)  BMI 32.36 kg/m2  SpO2 98%  BP Readings from Last 3 Encounters:  02/05/16 111/75  11/02/15 117/84  07/11/15 110/72    Wt Readings from Last 3 Encounters:  02/05/16 200 lb 6.4 oz (90.901 kg)  11/02/15 196 lb 9.6 oz (89.177 kg)  07/11/15 203 lb 9.6 oz (92.352 kg)     Physical Exam  Constitutional: She is oriented to person, place, and time. She appears well-developed and well-nourished. No distress.  HENT:  Head: Normocephalic and atraumatic.  Right Ear: External ear normal.  Left Ear: External ear normal.  Nose: Nose normal.  Mouth/Throat: Oropharynx is clear and moist.  Eyes: Conjunctivae and EOM are normal. Pupils are equal, round, and reactive to light.  Neck: Normal range of motion. Neck supple. No thyromegaly present.  Cardiovascular: Normal rate, regular rhythm and normal heart sounds.   No murmur heard. Pulmonary/Chest: Effort normal and breath sounds normal. No respiratory distress. She has no wheezes. She has no rales.  Abdominal: Soft. Bowel sounds are normal. She exhibits no distension. There is no tenderness.  Lymphadenopathy:    She has no cervical adenopathy.  Neurological: She is alert and oriented to person, place,  and time. She has normal reflexes.  Skin: Skin is warm and dry.  Psychiatric: She has a normal mood and affect. Her behavior is normal. Judgment and thought content normal.     Lab Results  Component Value Date   WBC 11.2* 03/15/2015   HGB 10.8*  03/15/2015   HCT 34.3* 03/15/2015   PLT CANCELED 03/15/2015   GLUCOSE 148* 03/15/2015   CHOL 182 11/02/2015   TRIG 296* 11/02/2015   HDL 34* 11/02/2015   LDLCALC 89 11/02/2015   ALT 15 03/15/2015   AST 13 03/15/2015   NA 138 03/15/2015   K 4.7 03/15/2015   CL 99 03/15/2015   CREATININE 0.59 03/15/2015   BUN 14 03/15/2015   CO2 24 03/15/2015   HGBA1C 7.9% 11/02/2015    Patient was never admitted.  Assessment & Plan:   Elyssa was seen today for diabetes, hypothyroidism and hyperlipidemia.  Diagnoses and all orders for this visit:  Type 2 diabetes, uncontrolled, with neuropathy ([place]) -     CMP14+EGFR -     Microalbumin / creatinine urine ratio -     Urinalysis -     Bayer DCA Hb A1c Waived -     Lipid panel -     CBC with Differential/Platelet  Dyslipidemia associated with type 2 diabetes mellitus (HCC) -     CMP14+EGFR -     Lipid panel  Anemia, iron deficiency -     CMP14+EGFR  Hypothyroidism, unspecified hypothyroidism type -     CMP14+EGFR  Other orders -     atorvastatin (LIPITOR) 20 MG tablet; Take 1 tablet (20 mg total) by mouth daily. -     citalopram (CELEXA) 20 MG tablet; Take 1 tablet (20 mg total) by mouth daily. -     metFORMIN (GLUCOPHAGE) 1000 MG tablet; Take 1 tablet (1,000 mg total) by mouth 2 (two) times daily with a meal. -     glucose blood (RELION PRIME TEST) test strip; USE ONE STRIP TO CHECK GLUCOSE 4 TIMES DAILY      I have changed Ms. Kreitz's RELION PRIME TEST to glucose blood. I am also having her maintain her blood glucose meter kit and supplies, cyclobenzaprine, RELION LANCING DEVICE, gabapentin, canagliflozin, levothyroxine, atorvastatin, citalopram, and metFORMIN.  Meds ordered this encounter  Medications  . levothyroxine (SYNTHROID, LEVOTHROID) 75 MCG tablet    Sig: Take 75 mcg by mouth daily before breakfast.  . atorvastatin (LIPITOR) 20 MG tablet    Sig: Take 1 tablet (20 mg total) by mouth daily.    Dispense:  90 tablet     Refill:  1  . citalopram (CELEXA) 20 MG tablet    Sig: Take 1 tablet (20 mg total) by mouth daily.    Dispense:  90 tablet    Refill:  1  . metFORMIN (GLUCOPHAGE) 1000 MG tablet    Sig: Take 1 tablet (1,000 mg total) by mouth 2 (two) times daily with a meal.    Dispense:  180 tablet    Refill:  1  . glucose blood (RELION PRIME TEST) test strip    Sig: USE ONE STRIP TO CHECK GLUCOSE 4 TIMES DAILY    Dispense:  100 each    Refill:  4    E11.9     Follow-up: Return in about 3 months (around 05/06/2016).  Claretta Fraise, M.D.

## 2016-02-05 NOTE — Patient Instructions (Signed)
Basic Carbohydrate Counting for Diabetes Mellitus Carbohydrate counting is a method for keeping track of the amount of carbohydrates you eat. Eating carbohydrates naturally increases the level of sugar (glucose) in your blood, so it is important for you to know the amount that is okay for you to have in every meal. Carbohydrate counting helps keep the level of glucose in your blood within normal limits. The amount of carbohydrates allowed is different for every person. A dietitian can help you calculate the amount that is right for you. Once you know the amount of carbohydrates you can have, you can count the carbohydrates in the foods you want to eat. Carbohydrates are found in the following foods:  Grains, such as breads and cereals.  Dried beans and soy products.  Starchy vegetables, such as potatoes, peas, and corn.  Fruit and fruit juices.  Milk and yogurt.  Sweets and snack foods, such as cake, cookies, candy, chips, soft drinks, and fruit drinks. CARBOHYDRATE COUNTING There are two ways to count the carbohydrates in your food. You can use either of the methods or a combination of both. Reading the "Nutrition Facts" on Packaged Food The "Nutrition Facts" is an area that is included on the labels of almost all packaged food and beverages in the [place]. It includes the serving size of that food or beverage and information about the nutrients in each serving of the food, including the grams (g) of carbohydrate per serving.  Decide the number of servings of this food or beverage that you will be able to eat or drink. Multiply that number of servings by the number of grams of carbohydrate that is listed on the label for that serving. The total will be the amount of carbohydrates you will be having when you eat or drink this food or beverage. Learning Standard Serving Sizes of Food When you eat food that is not packaged or does not include "Nutrition Facts" on the label, you need to  measure the servings in order to count the amount of carbohydrates.A serving of most carbohydrate-rich foods contains about 15 g of carbohydrates. The following list includes serving sizes of carbohydrate-rich foods that provide 15 g ofcarbohydrate per serving:   1 slice of bread (1 oz) or 1 six-inch tortilla.    of a hamburger bun or English muffin.  4-6 crackers.   cup unsweetened dry cereal.    cup hot cereal.   cup rice or pasta.    cup mashed potatoes or  of a large baked potato.  1 cup fresh fruit or one small piece of fruit.    cup canned or frozen fruit or fruit juice.  1 cup milk.   cup plain fat-free yogurt or yogurt sweetened with artificial sweeteners.   cup cooked dried beans or starchy vegetable, such as peas, corn, or potatoes.  Decide the number of standard-size servings that you will eat. Multiply that number of servings by 15 (the grams of carbohydrates in that serving). For example, if you eat 2 cups of strawberries, you will have eaten 2 servings and 30 g of carbohydrates (2 servings x 15 g = 30 g). For foods such as soups and casseroles, in which more than one food is mixed in, you will need to count the carbohydrates in each food that is included. EXAMPLE OF CARBOHYDRATE COUNTING Sample Dinner  3 oz chicken breast.   cup of brown rice.   cup of corn.  1 cup milk.   1 cup strawberries with   sugar-free whipped topping.  Carbohydrate Calculation Step 1: Identify the foods that contain carbohydrates:   Rice.   Corn.   Milk.   Strawberries. Step 2:Calculate the number of servings eaten of each:   2 servings of rice.   1 serving of corn.   1 serving of milk.   1 serving of strawberries. Step 3: Multiply each of those number of servings by 15 g:   2 servings of rice x 15 g = 30 g.   1 serving of corn x 15 g = 15 g.   1 serving of milk x 15 g = 15 g.   1 serving of strawberries x 15 g = 15 g. Step 4: Add  together all of the amounts to find the total grams of carbohydrates eaten: 30 g + 15 g + 15 g + 15 g = 75 g.   This information is not intended to replace advice given to you by your health care provider. Make sure you discuss any questions you have with your health care provider.   Document Released: 10/21/2005 Document Revised: 11/11/2014 Document Reviewed: 09/17/2013 Elsevier Interactive Patient Education 2016 [place]. Diabetes and Foot Care Diabetes may cause you to have problems because of poor blood supply (circulation) to your feet and legs. This may cause the skin on your feet to become thinner, break easier, and heal more slowly. Your skin may become dry, and the skin may peel and crack. You may also have nerve damage in your legs and feet causing decreased feeling in them. You may not notice minor injuries to your feet that could lead to infections or more serious problems. Taking care of your feet is one of the most important things you can do for yourself.  HOME CARE INSTRUCTIONS  Wear shoes at all times, even in the house. Do not go barefoot. Bare feet are easily injured.  Check your feet daily for blisters, cuts, and redness. If you cannot see the bottom of your feet, use a mirror or ask someone for help.  Wash your feet with warm water (do not use hot water) and mild soap. Then pat your feet and the areas between your toes until they are completely dry. Do not soak your feet as this can dry your skin.  Apply a moisturizing lotion or petroleum jelly (that does not contain alcohol and is unscented) to the skin on your feet and to dry, brittle toenails. Do not apply lotion between your toes.  Trim your toenails straight across. Do not dig under them or around the cuticle. File the edges of your nails with an emery board or nail file.  Do not cut corns or calluses or try to remove them with medicine.  Wear clean socks or stockings every day. Make sure they are not too tight.  Do not wear knee-high stockings since they may decrease blood flow to your legs.  Wear shoes that fit properly and have enough cushioning. To break in new shoes, wear them for just a few hours a day. This prevents you from injuring your feet. Always look in your shoes before you put them on to be sure there are no objects inside.  Do not cross your legs. This may decrease the blood flow to your feet.  If you find a minor scrape, cut, or break in the skin on your feet, keep it and the skin around it clean and dry. These areas may be cleansed with mild soap and water. Do not  cleanse the area with peroxide, alcohol, or iodine.  When you remove an adhesive bandage, be sure not to damage the skin around it.  If you have a wound, look at it several times a day to make sure it is healing.  Do not use heating pads or hot water bottles. They may burn your skin. If you have lost feeling in your feet or legs, you may not know it is happening until it is too late.  Make sure your health care provider performs a complete foot exam at least annually or more often if you have foot problems. Report any cuts, sores, or bruises to your health care provider immediately. SEEK MEDICAL CARE IF:   You have an injury that is not healing.  You have cuts or breaks in the skin.  You have an ingrown nail.  You notice redness on your legs or feet.  You feel burning or tingling in your legs or feet.  You have pain or cramps in your legs and feet.  Your legs or feet are numb.  Your feet always feel cold. SEEK IMMEDIATE MEDICAL CARE IF:   There is increasing redness, swelling, or pain in or around a wound.  There is a red line that goes up your leg.  Pus is coming from a wound.  You develop a fever or as directed by your health care provider.  You notice a bad smell coming from an ulcer or wound.   This information is not intended to replace advice given to you by your health care provider. Make sure you  discuss any questions you have with your health care provider.   Document Released: 10/18/2000 Document Revised: 06/23/2013 Document Reviewed: 03/30/2013 Elsevier Interactive Patient Education 2016 [employer]. Diabetes and Exercise Exercising regularly is important. It is not just about losing weight. It has many health benefits, such as:  Improving your overall fitness, flexibility, and endurance.  Increasing your bone density.  Helping with weight control.  Decreasing your body fat.  Increasing your muscle strength.  Reducing stress and tension.  Improving your overall health. People with diabetes who exercise gain additional benefits because exercise:  Reduces appetite.  Improves the body's use of blood sugar (glucose).  Helps lower or control blood glucose.  Decreases blood pressure.  Helps control blood lipids (such as cholesterol and triglycerides).  Improves the body's use of the hormone insulin by:  Increasing the body's insulin sensitivity.  Reducing the body's insulin needs.  Decreases the risk for heart disease because exercising:  Lowers cholesterol and triglycerides levels.  Increases the levels of good cholesterol (such as high-density lipoproteins [HDL]) in the body.  Lowers blood glucose levels. YOUR ACTIVITY PLAN  Choose an activity that you enjoy, and set realistic goals. To exercise safely, you should begin practicing any new physical activity slowly, and gradually increase the intensity of the exercise over time. Your health care provider or diabetes educator can help create an activity plan that works for you. General recommendations include:  Encouraging children to engage in at least 60 minutes of physical activity each day.  Stretching and performing strength training exercises, such as yoga or weight lifting, at least 2 times per week.  Performing a total of at least 150 minutes of moderate-intensity exercise each week, such as brisk  walking or water aerobics.  Exercising at least 3 days per week, making sure you allow no more than 2 consecutive days to pass without exercising.  Avoiding long periods of inactivity (90  minutes or more). When you have to spend an extended period of time sitting down, take frequent breaks to walk or stretch. RECOMMENDATIONS FOR EXERCISING WITH TYPE 1 OR TYPE 2 DIABETES   Check your blood glucose before exercising. If blood glucose levels are greater than 240 mg/dL, check for urine ketones. Do not exercise if ketones are present.  Avoid injecting insulin into areas of the body that are going to be exercised. For example, avoid injecting insulin into:  The arms when playing tennis.  The legs when jogging.  Keep a record of:  Food intake before and after you exercise.  Expected peak times of insulin action.  Blood glucose levels before and after you exercise.  The type and amount of exercise you have done.  Review your records with your health care provider. Your health care provider will help you to develop guidelines for adjusting food intake and insulin amounts before and after exercising.  If you take insulin or oral hypoglycemic agents, watch for signs and symptoms of hypoglycemia. They include:  Dizziness.  Shaking.  Sweating.  Chills.  Confusion.  Drink plenty of water while you exercise to prevent dehydration or heat stroke. Body water is lost during exercise and must be replaced.  Talk to your health care provider before starting an exercise program to make sure it is safe for you. Remember, almost any type of activity is better than none.   This information is not intended to replace advice given to you by your health care provider. Make sure you discuss any questions you have with your health care provider.   Document Released: 01/11/2004 Document Revised: 03/07/2015 Document Reviewed: 03/30/2013 Elsevier Interactive Patient Education [employer] Inc.

## 2016-02-20 ENCOUNTER — Telehealth: Payer: Self-pay | Admitting: Family Medicine

## 2016-02-24 NOTE — Telephone Encounter (Signed)
Detailed message left for patient.

## 2016-02-26 NOTE — Addendum Note (Signed)
Addended by: Fawn KirkHOLT, CATHY on: 02/26/2016 04:48 PM   Modules accepted: Kipp BroodSmartSet

## 2016-03-13 DIAGNOSIS — D509 Iron deficiency anemia, unspecified: Secondary | ICD-10-CM | POA: Diagnosis not present

## 2016-03-13 DIAGNOSIS — Z79899 Other long term (current) drug therapy: Secondary | ICD-10-CM | POA: Diagnosis not present

## 2016-03-13 DIAGNOSIS — E039 Hypothyroidism, unspecified: Secondary | ICD-10-CM | POA: Diagnosis not present

## 2016-03-13 DIAGNOSIS — E782 Mixed hyperlipidemia: Secondary | ICD-10-CM | POA: Diagnosis not present

## 2016-03-13 DIAGNOSIS — E1140 Type 2 diabetes mellitus with diabetic neuropathy, unspecified: Secondary | ICD-10-CM | POA: Diagnosis not present

## 2016-03-27 DIAGNOSIS — E1140 Type 2 diabetes mellitus with diabetic neuropathy, unspecified: Secondary | ICD-10-CM | POA: Diagnosis not present

## 2016-03-27 DIAGNOSIS — E039 Hypothyroidism, unspecified: Secondary | ICD-10-CM | POA: Diagnosis not present

## 2016-03-27 DIAGNOSIS — E782 Mixed hyperlipidemia: Secondary | ICD-10-CM | POA: Diagnosis not present

## 2016-03-27 DIAGNOSIS — D509 Iron deficiency anemia, unspecified: Secondary | ICD-10-CM | POA: Diagnosis not present

## 2016-05-10 ENCOUNTER — Ambulatory Visit: Payer: BLUE CROSS/BLUE SHIELD | Admitting: Family Medicine

## 2016-05-27 DIAGNOSIS — E782 Mixed hyperlipidemia: Secondary | ICD-10-CM | POA: Diagnosis not present

## 2016-05-27 DIAGNOSIS — Z008 Encounter for other general examination: Secondary | ICD-10-CM | POA: Diagnosis not present

## 2016-05-27 DIAGNOSIS — E039 Hypothyroidism, unspecified: Secondary | ICD-10-CM | POA: Diagnosis not present

## 2016-05-27 DIAGNOSIS — D509 Iron deficiency anemia, unspecified: Secondary | ICD-10-CM | POA: Diagnosis not present

## 2016-05-27 DIAGNOSIS — E1140 Type 2 diabetes mellitus with diabetic neuropathy, unspecified: Secondary | ICD-10-CM | POA: Diagnosis not present

## 2016-05-27 DIAGNOSIS — Z79899 Other long term (current) drug therapy: Secondary | ICD-10-CM | POA: Diagnosis not present

## 2016-05-27 DIAGNOSIS — E538 Deficiency of other specified B group vitamins: Secondary | ICD-10-CM | POA: Diagnosis not present

## 2016-06-05 ENCOUNTER — Encounter: Payer: Self-pay | Admitting: Family Medicine

## 2016-06-05 ENCOUNTER — Ambulatory Visit (INDEPENDENT_AMBULATORY_CARE_PROVIDER_SITE_OTHER): Payer: BLUE CROSS/BLUE SHIELD | Admitting: Family Medicine

## 2016-06-05 VITALS — BP 107/67 | HR 94 | Temp 97.7°F | Ht 66.0 in | Wt 203.2 lb

## 2016-06-05 DIAGNOSIS — F329 Major depressive disorder, single episode, unspecified: Secondary | ICD-10-CM

## 2016-06-05 DIAGNOSIS — E1169 Type 2 diabetes mellitus with other specified complication: Secondary | ICD-10-CM | POA: Diagnosis not present

## 2016-06-05 DIAGNOSIS — E1165 Type 2 diabetes mellitus with hyperglycemia: Secondary | ICD-10-CM | POA: Diagnosis not present

## 2016-06-05 DIAGNOSIS — D509 Iron deficiency anemia, unspecified: Secondary | ICD-10-CM

## 2016-06-05 DIAGNOSIS — E785 Hyperlipidemia, unspecified: Secondary | ICD-10-CM

## 2016-06-05 DIAGNOSIS — E1140 Type 2 diabetes mellitus with diabetic neuropathy, unspecified: Secondary | ICD-10-CM

## 2016-06-05 DIAGNOSIS — IMO0002 Reserved for concepts with insufficient information to code with codable children: Secondary | ICD-10-CM

## 2016-06-05 DIAGNOSIS — F32A Depression, unspecified: Secondary | ICD-10-CM

## 2016-06-05 DIAGNOSIS — E039 Hypothyroidism, unspecified: Secondary | ICD-10-CM | POA: Diagnosis not present

## 2016-06-05 MED ORDER — FERROUS FUMARATE 324 (106 FE) MG PO TABS: 106.0000 mg | ORAL_TABLET | Freq: Two times a day (BID) | ORAL | 5 refills | Status: DC

## 2016-06-05 NOTE — Progress Notes (Signed)
 Subjective:  Patient ID: Krista Tate, female    DOB: 01/18/1981  Age: 34 y.o. MRN: [id number]  CC: Diabetes (3 mth rck); Hypothyroidism; and Depression   HPI Krista Tate presents forFollow-up of diabetes. Patient checks blood sugar at home.  120 fasting and160postprandial Patient denies symptoms such as polyuria, polydipsia, excessive hunger, nausea No significant hypoglycemic spells noted. Medications as noted below. Taking them regularly without complication/adverse reaction being reported today. Checking feet daily.   Patient presents for follow-up on  thyroid. The patient has a history of hypothyroidism for many years. It has been stable recently. Pt. denies any change in  voice, loss of hair, heat or cold intolerance. Energy level has been adequate to good. Patient denies constipation and diarrhea. No myxedema. Medication is as noted below. Verified that pt is taking it daily on an empty stomach. Well tolerated.  Depression screen PHQ 2/9 06/05/2016 02/05/2016 11/02/2015 03/15/2015 10/12/2014  Decreased Interest 1 1 1 0 1  Down, Depressed, Hopeless 1 2 1 0 1  PHQ - 2 Score 2 3 2 0 2  Altered sleeping 1 2 3 - 2  Tired, decreased energy 1 2 3 - 2  Change in appetite 1 1 2 - 1  Feeling bad or failure about yourself  2 1 1 - 1  Trouble concentrating 1 1 1 - 1  Moving slowly or fidgety/restless 1 1 3 - 1  Suicidal thoughts 1 1 0 - 1  PHQ-9 Score 10 12 15 - 11  Difficult doing work/chores Somewhat difficult Not difficult at all Somewhat difficult - -   GAD 7 : Generalized Anxiety Score 06/05/2016 11/02/2015  Nervous, Anxious, on Edge 2 1  Control/stop worrying 3 3  Worry too much - different things 3 3  Trouble relaxing 3 3  Restless 3 2  Easily annoyed or irritable 3 2  Afraid - awful might happen 2 1  Total GAD 7 Score 19 15  Anxiety Difficulty Somewhat difficult Somewhat difficult   Stress related to remodel and moving. "Handling it."  History Krista Tate has a past medical  history of Depression; Diabetes mellitus without complication (HCC); Hyperlipidemia; and Thyroid disease.   She has no past surgical history on file.   Her family history includes Bipolar disorder in her sister; Healthy in her brother, father, mother, sister, and sister.She reports that she has never smoked. She has never used smokeless tobacco. She reports that she does not drink alcohol or use drugs.  Current Outpatient Prescriptions on File Prior to Visit  Medication Sig Dispense Refill  . atorvastatin (LIPITOR) 20 MG tablet Take 1 tablet (20 mg total) by mouth daily. 90 tablet 1  . blood glucose meter kit and supplies KIT Dispense based on patient and insurance preference. Use up to four times daily as directed. (FOR ICD-9 250.00, 250.01). 1 each 0  . citalopram (CELEXA) 20 MG tablet Take 1 tablet (20 mg total) by mouth daily. 90 tablet 1  . cyclobenzaprine (FLEXERIL) 10 MG tablet One at bedtime for shoulder spasm. May take up to TID for headache 90 tablet 5  . glucose blood (RELION PRIME TEST) test strip USE ONE STRIP TO CHECK GLUCOSE 4 TIMES DAILY 100 each 4  . Lancets Misc. (RELION LANCING DEVICE) KIT USE AS DIRECTED TO CHECK GLUCOSE FOUR TIMES DAILY 1 kit 0  . levothyroxine (SYNTHROID, LEVOTHROID) 75 MCG tablet Take 75 mcg by mouth daily before breakfast.    . metFORMIN (GLUCOPHAGE) 1000 MG tablet Take 1 tablet (1,000   mg total) by mouth 2 (two) times daily with a meal. 180 tablet 1   No current facility-administered medications on file prior to visit.     ROS Review of Systems  Constitutional: Negative for activity change, appetite change and fever.  HENT: Negative for congestion, rhinorrhea and sore throat.   Eyes: Negative for visual disturbance.  Respiratory: Negative for cough and shortness of breath.   Cardiovascular: Negative for chest pain and palpitations.  Gastrointestinal: Negative for abdominal pain, diarrhea and nausea.  Genitourinary: Negative for dysuria.    Musculoskeletal: Negative for arthralgias and myalgias.    Objective:  BP 107/67 (BP Location: Left Arm, Patient Position: Sitting, Cuff Size: Normal)   Pulse 94   Temp 97.7 F (36.5 C) (Oral)   Ht 5' 6" (1.676 m)   Wt 203 lb 3.2 oz (92.2 kg)   LMP 05/26/2016 (Approximate)   SpO2 97%   BMI 32.80 kg/m   BP Readings from Last 3 Encounters:  06/05/16 107/67  02/05/16 111/75  11/02/15 117/84    Wt Readings from Last 3 Encounters:  06/05/16 203 lb 3.2 oz (92.2 kg)  02/05/16 200 lb 6.4 oz (90.9 kg)  11/02/15 196 lb 9.6 oz (89.2 kg)     Physical Exam  Constitutional: She is oriented to person, place, and time. She appears well-developed and well-nourished. No distress.  HENT:  Head: Normocephalic and atraumatic.  Right Ear: External ear normal.  Left Ear: External ear normal.  Nose: Nose normal.  Mouth/Throat: Oropharynx is clear and moist.  Eyes: Conjunctivae and EOM are normal. Pupils are equal, round, and reactive to light.  Neck: Normal range of motion. Neck supple. No thyromegaly present.  Cardiovascular: Normal rate, regular rhythm and normal heart sounds.   No murmur heard. Pulmonary/Chest: Effort normal and breath sounds normal. No respiratory distress. She has no wheezes. She has no rales.  Abdominal: Soft. Bowel sounds are normal. She exhibits no distension. There is no tenderness.  Lymphadenopathy:    She has no cervical adenopathy.  Neurological: She is alert and oriented to person, place, and time. She has normal reflexes.  Skin: Skin is warm and dry.  Psychiatric: She has a normal mood and affect. Her behavior is normal. Judgment and thought content normal.    Lab Results  Component Value Date   HGBA1C 7.9 11/02/2015   HGBA1C 7.8 03/15/2015   HGBA1C 7.2 12/13/2014    Lab Results  Component Value Date   WBC 11.2 (A) 03/15/2015   HGB 10.8 (A) 03/15/2015   HCT 34.3 (A) 03/15/2015   PLT CANCELED 03/15/2015   GLUCOSE 148 (H) 03/15/2015   CHOL 182  11/02/2015   TRIG 296 (H) 11/02/2015   HDL 34 (L) 11/02/2015   LDLCALC 89 11/02/2015   ALT 15 03/15/2015   AST 13 03/15/2015   NA 138 03/15/2015   K 4.7 03/15/2015   CL 99 03/15/2015   CREATININE 0.59 03/15/2015   BUN 14 03/15/2015   CO2 24 03/15/2015   HGBA1C 7.9 11/02/2015   Labs from work reviewed. PA at work increased thyroid med. HbA1c = 7.2 last week. See scanned doc.  Assessment & Plan:   Krista Tate was seen today for diabetes, hypothyroidism and depression.  Diagnoses and all orders for this visit:  Type 2 diabetes, uncontrolled, with neuropathy (HCC)  Hypothyroidism, unspecified hypothyroidism type  Anemia, iron deficiency  Other orders -     Ferrous Fumarate (HEMOCYTE - 106 MG FE) 324 (106 Fe) MG TABS tablet; Take 1   tablet (106 mg of iron total) by mouth 2 (two) times daily.      I have discontinued Ms. Nouri's gabapentin and canagliflozin. I am also having her start on Ferrous Fumarate. Additionally, I am having her maintain her blood glucose meter kit and supplies, cyclobenzaprine, RELION LANCING DEVICE, levothyroxine, atorvastatin, citalopram, metFORMIN, and glucose blood.  Meds ordered this encounter  Medications  . Ferrous Fumarate (HEMOCYTE - 106 MG FE) 324 (106 Fe) MG TABS tablet    Sig: Take 1 tablet (106 mg of iron total) by mouth 2 (two) times daily.    Dispense:  60 tablet    Refill:  5     Follow-up: Return in about 3 months (around 09/05/2016).  Warren Stacks, M.D. 

## 2016-06-21 ENCOUNTER — Telehealth: Payer: Self-pay | Admitting: Family Medicine

## 2016-06-24 DIAGNOSIS — D509 Iron deficiency anemia, unspecified: Secondary | ICD-10-CM | POA: Diagnosis not present

## 2016-06-24 DIAGNOSIS — E782 Mixed hyperlipidemia: Secondary | ICD-10-CM | POA: Diagnosis not present

## 2016-06-24 DIAGNOSIS — E1140 Type 2 diabetes mellitus with diabetic neuropathy, unspecified: Secondary | ICD-10-CM | POA: Diagnosis not present

## 2016-06-24 DIAGNOSIS — E039 Hypothyroidism, unspecified: Secondary | ICD-10-CM | POA: Diagnosis not present

## 2016-08-13 ENCOUNTER — Other Ambulatory Visit: Payer: Self-pay | Admitting: Family Medicine

## 2016-09-02 DIAGNOSIS — E1140 Type 2 diabetes mellitus with diabetic neuropathy, unspecified: Secondary | ICD-10-CM | POA: Diagnosis not present

## 2016-09-02 DIAGNOSIS — E538 Deficiency of other specified B group vitamins: Secondary | ICD-10-CM | POA: Diagnosis not present

## 2016-09-02 DIAGNOSIS — E039 Hypothyroidism, unspecified: Secondary | ICD-10-CM | POA: Diagnosis not present

## 2016-09-02 DIAGNOSIS — D509 Iron deficiency anemia, unspecified: Secondary | ICD-10-CM | POA: Diagnosis not present

## 2016-09-02 DIAGNOSIS — E782 Mixed hyperlipidemia: Secondary | ICD-10-CM | POA: Diagnosis not present

## 2016-09-02 DIAGNOSIS — Z008 Encounter for other general examination: Secondary | ICD-10-CM | POA: Diagnosis not present

## 2016-09-06 ENCOUNTER — Ambulatory Visit: Payer: BLUE CROSS/BLUE SHIELD | Admitting: Family Medicine

## 2016-09-16 DIAGNOSIS — E039 Hypothyroidism, unspecified: Secondary | ICD-10-CM | POA: Diagnosis not present

## 2016-09-16 DIAGNOSIS — E1140 Type 2 diabetes mellitus with diabetic neuropathy, unspecified: Secondary | ICD-10-CM | POA: Diagnosis not present

## 2016-09-16 DIAGNOSIS — D509 Iron deficiency anemia, unspecified: Secondary | ICD-10-CM | POA: Diagnosis not present

## 2016-09-16 DIAGNOSIS — E782 Mixed hyperlipidemia: Secondary | ICD-10-CM | POA: Diagnosis not present

## 2016-09-17 ENCOUNTER — Telehealth: Payer: Self-pay | Admitting: *Deleted

## 2016-09-17 NOTE — Telephone Encounter (Signed)
Left message for patient to call back a reschedule appt with Dr. Darlyn ReadStacks on 11/15 due to Dr. Darlyn ReadStacks being out of the office.

## 2016-09-18 ENCOUNTER — Ambulatory Visit: Payer: BLUE CROSS/BLUE SHIELD | Admitting: Family Medicine

## 2016-09-25 ENCOUNTER — Ambulatory Visit: Payer: BLUE CROSS/BLUE SHIELD | Admitting: Family Medicine

## 2016-09-30 ENCOUNTER — Encounter: Payer: Self-pay | Admitting: Family Medicine

## 2016-09-30 ENCOUNTER — Telehealth: Payer: Self-pay | Admitting: Family Medicine

## 2016-10-10 NOTE — Telephone Encounter (Signed)
Pt has appt scheduled with Dr. Darlyn ReadStacks 10/14/16.

## 2016-10-13 NOTE — Progress Notes (Signed)
Subjective:  Patient ID: Krista Tate, female    DOB: 1981-04-17  Age: 35 y.o. MRN: [id number]  CC: No chief complaint on file.   HPI Krista Tate no showed visit  Follow-up anemia   History Krista Tate has a past medical history of Depression; Diabetes mellitus without complication ([place]); Hyperlipidemia; and Thyroid disease.   She has no past surgical history on file.   Her family history includes Bipolar disorder in her sister; Healthy in her brother, father, mother, sister, and sister.She reports that she has never smoked. She has never used smokeless tobacco. She reports that she does not drink alcohol or use drugs.  Current Outpatient Prescriptions on File Prior to Visit  Medication Sig Dispense Refill  . atorvastatin (LIPITOR) 20 MG tablet TAKE 1 TABLET BY MOUTH  DAILY 90 tablet 0  . blood glucose meter kit and supplies KIT Dispense based on patient and insurance preference. Use up to four times daily as directed. (FOR ICD-9 250.00, 250.01). 1 each 0  . citalopram (CELEXA) 20 MG tablet TAKE 1 TABLET BY MOUTH  DAILY 90 tablet 0  . cyclobenzaprine (FLEXERIL) 10 MG tablet One at bedtime for shoulder spasm. May take up to TID for headache 90 tablet 5  . Ferrous Fumarate (HEMOCYTE - 106 MG FE) 324 (106 Fe) MG TABS tablet Take 1 tablet (106 mg of iron total) by mouth 2 (two) times daily. 60 tablet 5  . glucose blood (RELION PRIME TEST) test strip USE ONE STRIP TO CHECK GLUCOSE 4 TIMES DAILY 100 each 4  . Lancets Misc. (RELION LANCING DEVICE) KIT USE AS DIRECTED TO CHECK GLUCOSE FOUR TIMES DAILY 1 kit 0  . levothyroxine (SYNTHROID, LEVOTHROID) 75 MCG tablet Take 75 mcg by mouth daily before breakfast.    . metFORMIN (GLUCOPHAGE) 1000 MG tablet TAKE 1 TABLET BY MOUTH TWO  TIMES DAILY WITH A MEAL 180 tablet 0   No current facility-administered medications on file prior to visit.     ROS Review of Systems    Objective:  There were no vitals taken for this visit.  BP Readings from  Last 3 Encounters:  06/05/16 107/67  02/05/16 111/75  11/02/15 117/84    Wt Readings from Last 3 Encounters:  06/05/16 203 lb 3.2 oz (92.2 kg)  02/05/16 200 lb 6.4 oz (90.9 kg)  11/02/15 196 lb 9.6 oz (89.2 kg)     Physical Exam    Lab Results  Component Value Date   HGBA1C 7.9 11/02/2015   HGBA1C 7.8 03/15/2015   HGBA1C 7.2 12/13/2014    Lab Results  Component Value Date   WBC 11.2 (A) 03/15/2015   HGB 10.8 (A) 03/15/2015   HCT 34.3 (A) 03/15/2015   PLT CANCELED 03/15/2015   GLUCOSE 148 (H) 03/15/2015   CHOL 182 11/02/2015   TRIG 296 (H) 11/02/2015   HDL 34 (L) 11/02/2015   LDLCALC 89 11/02/2015   ALT 15 03/15/2015   AST 13 03/15/2015   NA 138 03/15/2015   K 4.7 03/15/2015   CL 99 03/15/2015   CREATININE 0.59 03/15/2015   BUN 14 03/15/2015   CO2 24 03/15/2015   HGBA1C 7.9 11/02/2015    Patient was never admitted.  Assessment & Plan:   Diagnoses and all orders for this visit:  Iron deficiency anemia, unspecified iron deficiency anemia type  Dyslipidemia associated with type 2 diabetes mellitus (HCC)  Hypothyroidism, unspecified type  Type 2 diabetes, uncontrolled, with neuropathy ([place]) -     CMP14+EGFR -  Bayer DCA Hb A1c Waived -     Microalbumin / creatinine urine ratio -     TSH + free T4 -     Lipid panel   I am having Krista Tate maintain her blood glucose meter kit and supplies, cyclobenzaprine, RELION LANCING DEVICE, levothyroxine, glucose blood, Ferrous Fumarate, citalopram, atorvastatin, and metFORMIN.  No orders of the defined types were placed in this encounter.    Follow-up: No Follow-up on file.  Claretta Fraise, M.D.

## 2016-10-14 ENCOUNTER — Encounter: Payer: Self-pay | Admitting: Family Medicine

## 2016-10-15 ENCOUNTER — Telehealth: Payer: Self-pay | Admitting: Family Medicine

## 2016-10-15 ENCOUNTER — Encounter: Payer: Self-pay | Admitting: Family Medicine

## 2016-10-17 NOTE — Telephone Encounter (Signed)
Left detailed message stating she missed her appt and to call back to reschedule.

## 2016-11-11 DIAGNOSIS — D509 Iron deficiency anemia, unspecified: Secondary | ICD-10-CM | POA: Diagnosis not present

## 2016-11-11 DIAGNOSIS — E1140 Type 2 diabetes mellitus with diabetic neuropathy, unspecified: Secondary | ICD-10-CM | POA: Diagnosis not present

## 2016-11-11 DIAGNOSIS — E782 Mixed hyperlipidemia: Secondary | ICD-10-CM | POA: Diagnosis not present

## 2016-11-11 DIAGNOSIS — E039 Hypothyroidism, unspecified: Secondary | ICD-10-CM | POA: Diagnosis not present

## 2016-11-11 DIAGNOSIS — E559 Vitamin D deficiency, unspecified: Secondary | ICD-10-CM | POA: Diagnosis not present

## 2016-11-20 DIAGNOSIS — E1140 Type 2 diabetes mellitus with diabetic neuropathy, unspecified: Secondary | ICD-10-CM | POA: Diagnosis not present

## 2016-11-20 DIAGNOSIS — D509 Iron deficiency anemia, unspecified: Secondary | ICD-10-CM | POA: Diagnosis not present

## 2016-11-20 DIAGNOSIS — E039 Hypothyroidism, unspecified: Secondary | ICD-10-CM | POA: Diagnosis not present

## 2016-11-20 DIAGNOSIS — E782 Mixed hyperlipidemia: Secondary | ICD-10-CM | POA: Diagnosis not present

## 2016-12-05 ENCOUNTER — Other Ambulatory Visit: Payer: Self-pay | Admitting: Family Medicine

## 2016-12-24 ENCOUNTER — Ambulatory Visit: Payer: Self-pay | Admitting: Family Medicine

## 2017-01-01 ENCOUNTER — Ambulatory Visit (INDEPENDENT_AMBULATORY_CARE_PROVIDER_SITE_OTHER): Payer: BLUE CROSS/BLUE SHIELD | Admitting: Family Medicine

## 2017-01-01 ENCOUNTER — Encounter: Payer: Self-pay | Admitting: Family Medicine

## 2017-01-01 VITALS — BP 122/76 | HR 80 | Temp 97.7°F | Ht 66.0 in | Wt 212.0 lb

## 2017-01-01 DIAGNOSIS — E785 Hyperlipidemia, unspecified: Secondary | ICD-10-CM | POA: Diagnosis not present

## 2017-01-01 DIAGNOSIS — IMO0002 Reserved for concepts with insufficient information to code with codable children: Secondary | ICD-10-CM

## 2017-01-01 DIAGNOSIS — E1169 Type 2 diabetes mellitus with other specified complication: Secondary | ICD-10-CM

## 2017-01-01 DIAGNOSIS — E1140 Type 2 diabetes mellitus with diabetic neuropathy, unspecified: Secondary | ICD-10-CM | POA: Diagnosis not present

## 2017-01-01 DIAGNOSIS — D509 Iron deficiency anemia, unspecified: Secondary | ICD-10-CM

## 2017-01-01 DIAGNOSIS — E1165 Type 2 diabetes mellitus with hyperglycemia: Secondary | ICD-10-CM

## 2017-01-01 LAB — BAYER DCA HB A1C WAIVED: HB A1C (BAYER DCA - WAIVED): 8.9 % — ABNORMAL HIGH (ref <7.0)

## 2017-01-01 MED ORDER — FERROUS FUMARATE 324 (106 FE) MG PO TABS: 106.0000 mg | ORAL_TABLET | Freq: Every day | ORAL | 5 refills | Status: DC

## 2017-01-01 NOTE — Progress Notes (Signed)
Subjective:  Patient ID: Krista Tate, female    DOB: [birth year]  Age: 36 y.o. MRN: [id number]  CC: Diabetes (pt here today for routine follow up on her diabetes and thyroid)   HPI Krista Tate presents for  follow-up of hypertension. Patient has no history of headache chest pain or shortness of breath or recent cough. Patient also denies symptoms of TIA such as numbness weakness lateralizing. Patient does not blood pressure at home.Patient denies side effects from medication. States taking it regularly.  Patient also  in for follow-up of elevated cholesterol. Doing well without complaints on current medication. Denies side effects of statin including myalgia and arthralgia and nausea. Also in today for liver function testing. Currently no chest pain, shortness of breath or other cardiovascular related symptoms noted.  Follow-up of diabetes. Patient does check blood sugar at home infrequently. Numbers in the 200 range Patient denies symptoms such as polyuria, polydipsia, excessive hunger, nausea No significant hypoglycemic spells noted. Medications reviewed. Pt reports taking them regularly. Pt. denies complication/adverse reaction today.    History Krista Tate has a past medical history of Depression; Diabetes mellitus without complication ([place]); Hyperlipidemia; and Thyroid disease.   She has no past surgical history on file.   Her family history includes Bipolar disorder in her sister; Healthy in her brother, father, mother, sister, and sister.She reports that she has never smoked. She has never used smokeless tobacco. She reports that she does not drink alcohol or use drugs.  Current Outpatient Prescriptions on File Prior to Visit  Medication Sig Dispense Refill  . atorvastatin (LIPITOR) 20 MG tablet TAKE 1 TABLET BY MOUTH  DAILY 90 tablet 1  . citalopram (CELEXA) 20 MG tablet TAKE 1 TABLET BY MOUTH  DAILY 90 tablet 1  . cyclobenzaprine (FLEXERIL) 10 MG tablet One at bedtime for  shoulder spasm. May take up to TID for headache 90 tablet 5  . metFORMIN (GLUCOPHAGE) 1000 MG tablet TAKE 1 TABLET BY MOUTH TWO  TIMES DAILY WITH MEALS 180 tablet 1   No current facility-administered medications on file prior to visit.     ROS Review of Systems  Constitutional: Negative for activity change, appetite change and fever.  HENT: Negative for congestion, rhinorrhea and sore throat.   Eyes: Negative for visual disturbance.  Respiratory: Negative for cough and shortness of breath.   Cardiovascular: Negative for chest pain and palpitations.  Gastrointestinal: Negative for abdominal pain, diarrhea and nausea.  Genitourinary: Negative for dysuria.  Musculoskeletal: Negative for arthralgias and myalgias.    Objective:  BP 122/76   Pulse 80   Temp 97.7 F (36.5 C) (Oral)   Ht '5\' 6"'$  (1.676 m)   Wt 212 lb (96.2 kg)   BMI 34.22 kg/m   BP Readings from Last 3 Encounters:  01/01/17 122/76  06/05/16 107/67  02/05/16 111/75    Wt Readings from Last 3 Encounters:  01/01/17 212 lb (96.2 kg)  06/05/16 203 lb 3.2 oz (92.2 kg)  02/05/16 200 lb 6.4 oz (90.9 kg)     Physical Exam  Constitutional: She is oriented to person, place, and time. She appears well-developed and well-nourished. No distress.  HENT:  Head: Normocephalic and atraumatic.  Right Ear: External ear normal.  Left Ear: External ear normal.  Nose: Nose normal.  Mouth/Throat: Oropharynx is clear and moist.  Eyes: Conjunctivae and EOM are normal. Pupils are equal, round, and reactive to light.  Neck: Normal range of motion. Neck supple. No thyromegaly present.  Cardiovascular: Normal rate, regular  rhythm and normal heart sounds.   No murmur heard. Pulmonary/Chest: Effort normal and breath sounds normal. No respiratory distress. She has no wheezes. She has no rales.  Abdominal: Soft. Bowel sounds are normal. She exhibits no distension. There is no tenderness.  Lymphadenopathy:    She has no cervical  adenopathy.  Neurological: She is alert and oriented to person, place, and time. She has normal reflexes.  Skin: Skin is warm and dry.  Psychiatric: She has a normal mood and affect. Her behavior is normal. Judgment and thought content normal.    Results for orders placed or performed in visit on 01/01/17  Bayer DCA Hb A1c Waived  Result Value Ref Range   Bayer DCA Hb A1c Waived 8.9 (H) <7.0 %       Assessment & Plan:   Krista Tate was seen today for diabetes.  Diagnoses and all orders for this visit:  Type 2 diabetes, uncontrolled, with neuropathy ([place]) -     Bayer DCA Hb A1c Waived  Dyslipidemia associated with type 2 diabetes mellitus (HCC)  Iron deficiency anemia, unspecified iron deficiency anemia type  Other orders -     Ferrous Fumarate (HEMOCYTE - 106 MG FE) 324 (106 Fe) MG TABS tablet; Take 1 tablet (106 mg of iron total) by mouth daily.   I have discontinued Krista Tate's blood glucose meter kit and supplies, RELION LANCING DEVICE, and glucose blood. I have also changed her Ferrous Fumarate. Additionally, I am having her maintain her cyclobenzaprine, metFORMIN, atorvastatin, citalopram, levothyroxine, and levothyroxine.  Meds ordered this encounter  Medications  . levothyroxine (SYNTHROID, LEVOTHROID) 88 MCG tablet    Sig: Take 88 mcg by mouth daily before breakfast.  . levothyroxine (SYNTHROID, LEVOTHROID) 100 MCG tablet    Sig: Take 100 mcg by mouth as directed. Twice weekly  . Ferrous Fumarate (HEMOCYTE - 106 MG FE) 324 (106 Fe) MG TABS tablet    Sig: Take 1 tablet (106 mg of iron total) by mouth daily.    Dispense:  60 tablet    Refill:  5   Discussed compliance with basic diabetic diet, checking glucose & exercise. Encouraged patient to use today's visit as a reset.  Follow-up: Return in about 3 months (around 03/31/2017).  Claretta Fraise, M.D.

## 2017-01-15 DIAGNOSIS — E782 Mixed hyperlipidemia: Secondary | ICD-10-CM | POA: Diagnosis not present

## 2017-01-15 DIAGNOSIS — D509 Iron deficiency anemia, unspecified: Secondary | ICD-10-CM | POA: Diagnosis not present

## 2017-01-15 DIAGNOSIS — Z79899 Other long term (current) drug therapy: Secondary | ICD-10-CM | POA: Diagnosis not present

## 2017-01-15 DIAGNOSIS — E559 Vitamin D deficiency, unspecified: Secondary | ICD-10-CM | POA: Diagnosis not present

## 2017-01-15 DIAGNOSIS — E039 Hypothyroidism, unspecified: Secondary | ICD-10-CM | POA: Diagnosis not present

## 2017-01-15 DIAGNOSIS — E1140 Type 2 diabetes mellitus with diabetic neuropathy, unspecified: Secondary | ICD-10-CM | POA: Diagnosis not present

## 2017-01-21 ENCOUNTER — Other Ambulatory Visit: Payer: Self-pay | Admitting: Family Medicine

## 2017-01-29 DIAGNOSIS — E1140 Type 2 diabetes mellitus with diabetic neuropathy, unspecified: Secondary | ICD-10-CM | POA: Diagnosis not present

## 2017-01-29 DIAGNOSIS — E782 Mixed hyperlipidemia: Secondary | ICD-10-CM | POA: Diagnosis not present

## 2017-01-29 DIAGNOSIS — D509 Iron deficiency anemia, unspecified: Secondary | ICD-10-CM | POA: Diagnosis not present

## 2017-01-29 DIAGNOSIS — E039 Hypothyroidism, unspecified: Secondary | ICD-10-CM | POA: Diagnosis not present

## 2017-04-02 ENCOUNTER — Ambulatory Visit: Payer: BLUE CROSS/BLUE SHIELD | Admitting: Family Medicine

## 2017-04-09 DIAGNOSIS — E782 Mixed hyperlipidemia: Secondary | ICD-10-CM | POA: Diagnosis not present

## 2017-04-09 DIAGNOSIS — E1140 Type 2 diabetes mellitus with diabetic neuropathy, unspecified: Secondary | ICD-10-CM | POA: Diagnosis not present

## 2017-04-09 DIAGNOSIS — Z6833 Body mass index (BMI) 33.0-33.9, adult: Secondary | ICD-10-CM | POA: Diagnosis not present

## 2017-04-09 DIAGNOSIS — Z008 Encounter for other general examination: Secondary | ICD-10-CM | POA: Diagnosis not present

## 2017-04-09 DIAGNOSIS — D509 Iron deficiency anemia, unspecified: Secondary | ICD-10-CM | POA: Diagnosis not present

## 2017-04-09 DIAGNOSIS — E039 Hypothyroidism, unspecified: Secondary | ICD-10-CM | POA: Diagnosis not present

## 2017-04-09 DIAGNOSIS — E669 Obesity, unspecified: Secondary | ICD-10-CM | POA: Diagnosis not present

## 2017-04-14 DIAGNOSIS — E1140 Type 2 diabetes mellitus with diabetic neuropathy, unspecified: Secondary | ICD-10-CM | POA: Diagnosis not present

## 2017-04-14 DIAGNOSIS — D509 Iron deficiency anemia, unspecified: Secondary | ICD-10-CM | POA: Diagnosis not present

## 2017-04-14 DIAGNOSIS — E039 Hypothyroidism, unspecified: Secondary | ICD-10-CM | POA: Diagnosis not present

## 2017-04-14 DIAGNOSIS — E782 Mixed hyperlipidemia: Secondary | ICD-10-CM | POA: Diagnosis not present

## 2017-05-24 ENCOUNTER — Other Ambulatory Visit: Payer: Self-pay | Admitting: Family Medicine

## 2017-05-26 MED ORDER — CITALOPRAM HYDROBROMIDE 20 MG PO TABS: 20.0000 mg | ORAL_TABLET | Freq: Every day | ORAL | 0 refills | Status: DC

## 2017-05-26 MED ORDER — METFORMIN HCL 1000 MG PO TABS: ORAL_TABLET | ORAL | 0 refills | Status: DC

## 2017-05-26 MED ORDER — ATORVASTATIN CALCIUM 20 MG PO TABS: 20.0000 mg | ORAL_TABLET | Freq: Every day | ORAL | 0 refills | Status: DC

## 2017-05-26 NOTE — Telephone Encounter (Signed)
[facility]  [facility] and will sch a appt

## 2017-05-26 NOTE — Telephone Encounter (Signed)
Authorize 30 days only. Then contact the patient letting them know that they will need an appointment before any further prescriptions can be sent in. 

## 2017-06-04 DIAGNOSIS — E559 Vitamin D deficiency, unspecified: Secondary | ICD-10-CM | POA: Diagnosis not present

## 2017-06-04 DIAGNOSIS — Z79899 Other long term (current) drug therapy: Secondary | ICD-10-CM | POA: Diagnosis not present

## 2017-06-04 DIAGNOSIS — D509 Iron deficiency anemia, unspecified: Secondary | ICD-10-CM | POA: Diagnosis not present

## 2017-06-04 DIAGNOSIS — E1140 Type 2 diabetes mellitus with diabetic neuropathy, unspecified: Secondary | ICD-10-CM | POA: Diagnosis not present

## 2017-06-04 DIAGNOSIS — Z139 Encounter for screening, unspecified: Secondary | ICD-10-CM | POA: Diagnosis not present

## 2017-06-04 DIAGNOSIS — E039 Hypothyroidism, unspecified: Secondary | ICD-10-CM | POA: Diagnosis not present

## 2017-06-04 DIAGNOSIS — E782 Mixed hyperlipidemia: Secondary | ICD-10-CM | POA: Diagnosis not present

## 2017-06-18 ENCOUNTER — Ambulatory Visit (INDEPENDENT_AMBULATORY_CARE_PROVIDER_SITE_OTHER): Payer: BLUE CROSS/BLUE SHIELD | Admitting: Family Medicine

## 2017-06-18 ENCOUNTER — Encounter: Payer: Self-pay | Admitting: Family Medicine

## 2017-06-18 VITALS — BP 129/80 | HR 96 | Temp 98.0°F | Ht 66.0 in | Wt 202.0 lb

## 2017-06-18 DIAGNOSIS — E1140 Type 2 diabetes mellitus with diabetic neuropathy, unspecified: Secondary | ICD-10-CM | POA: Diagnosis not present

## 2017-06-18 DIAGNOSIS — E1165 Type 2 diabetes mellitus with hyperglycemia: Secondary | ICD-10-CM

## 2017-06-18 DIAGNOSIS — E782 Mixed hyperlipidemia: Secondary | ICD-10-CM | POA: Diagnosis not present

## 2017-06-18 DIAGNOSIS — E1169 Type 2 diabetes mellitus with other specified complication: Secondary | ICD-10-CM | POA: Diagnosis not present

## 2017-06-18 DIAGNOSIS — E785 Hyperlipidemia, unspecified: Secondary | ICD-10-CM | POA: Diagnosis not present

## 2017-06-18 DIAGNOSIS — D509 Iron deficiency anemia, unspecified: Secondary | ICD-10-CM | POA: Diagnosis not present

## 2017-06-18 DIAGNOSIS — IMO0002 Reserved for concepts with insufficient information to code with codable children: Secondary | ICD-10-CM

## 2017-06-18 DIAGNOSIS — E039 Hypothyroidism, unspecified: Secondary | ICD-10-CM | POA: Diagnosis not present

## 2017-06-18 MED ORDER — MIRTAZAPINE 30 MG PO TABS: 30.0000 mg | ORAL_TABLET | Freq: Every day | ORAL | 0 refills | Status: DC

## 2017-06-18 MED ORDER — SITAGLIP PHOS-METFORMIN HCL ER 50-1000 MG PO TB24: ORAL_TABLET | ORAL | 0 refills | Status: DC

## 2017-06-18 NOTE — Progress Notes (Signed)
Subjective:  Patient ID: Krista Tate,  female    DOB: [birth year]  Age: 36 y.o.    CC: Diabetes (pt here today for routine follow up of her chronic medical conditions and also c/o insomnia)   HPI Krista Tate presents for  follow-up of elevated cholesterol. Doing well without complaints on current medication. Denies side effects of statin including myalgia and arthralgia and nausea. Also in today for liver function testing. Currently no chest pain, shortness of breath or other cardiovascular related symptoms noted.  Follow-up of diabetes. Patient does not check blood sugar at home. Patient denies symptoms such as polyuria, polydipsia, excessive hunger, nausea.No significant hypoglycemic spells noted. Medications reviewed. Pt reports taking them regularly. Pt. denies complication/adverse reaction today.   Patient presents for follow-up on  thyroid. The patient has a history of hypothyroidism for many years. It has been stable recently. Pt. denies any change in  voice, loss of hair, heat or cold intolerance. Energy level has been adequate to good. Patient denies constipation and diarrhea. No myxedema. Medication is as noted below. Verified that pt is taking it daily on an empty stomach. Well tolerated. History Krista Tate has a past medical history of Depression; Diabetes mellitus without complication (HCC); Hyperlipidemia; and Thyroid disease.   She has no past surgical history on file.   Her family history includes Bipolar disorder in her sister; Healthy in her brother, father, mother, sister, and sister.She reports that she has never smoked. She has never used smokeless tobacco. She reports that she does not drink alcohol or use drugs.  Current Outpatient Prescriptions on File Prior to Visit  Medication Sig Dispense Refill  . atorvastatin (LIPITOR) 20 MG tablet Take 1 tablet (20 mg total) by mouth daily. 30 tablet 0  . cyclobenzaprine (FLEXERIL) 10 MG tablet TAKE 1 TABLET BY MOUTH AT  BEDTIME AS NEEDED FOR SHOULDER SPASMS OR MAY TAKE 1 UP TO 3 TIMES A DAY AS NEEDED FOR HEADACHE 90 tablet 5   No current facility-administered medications on file prior to visit.     ROS Review of Systems  Constitutional: Negative for activity change, appetite change and fever.  HENT: Negative for congestion, rhinorrhea and sore throat.   Eyes: Negative for visual disturbance.  Respiratory: Negative for cough and shortness of breath.   Cardiovascular: Negative for chest pain and palpitations.  Gastrointestinal: Negative for abdominal pain, diarrhea and nausea.  Genitourinary: Negative for dysuria.  Musculoskeletal: Negative for arthralgias and myalgias.    Objective:  BP 129/80   Pulse 96   Temp 98 F (36.7 C) (Oral)   Ht 5\' 6"  (1.676 m)   Wt 202 lb (91.6 kg)   BMI 32.60 kg/m   BP Readings from Last 3 Encounters:  06/18/17 129/80  01/01/17 122/76  06/05/16 107/67    Wt Readings from Last 3 Encounters:  06/18/17 202 lb (91.6 kg)  01/01/17 212 lb (96.2 kg)  06/05/16 203 lb 3.2 oz (92.2 kg)     Physical Exam  Constitutional: She is oriented to person, place, and time. She appears well-developed and well-nourished. No distress.  HENT:  Head: Normocephalic and atraumatic.  Right Ear: External ear normal.  Left Ear: External ear normal.  Nose: Nose normal.  Mouth/Throat: Oropharynx is clear and moist.  Eyes: Pupils are equal, round, and reactive to light. Conjunctivae and EOM are normal.  Neck: Normal range of motion. Neck supple. No thyromegaly present.  Cardiovascular: Normal rate, regular rhythm and normal heart sounds.   No murmur heard.  Pulmonary/Chest: Effort normal and breath sounds normal. No respiratory distress. She has no wheezes. She has no rales.  Abdominal: Soft. Bowel sounds are normal. She exhibits no distension. There is no tenderness.  Lymphadenopathy:    She has no cervical adenopathy.  Neurological: She is alert and oriented to person, place, and  time. She has normal reflexes.  Skin: Skin is warm and dry.  Psychiatric: She has a normal mood and affect. Her behavior is normal. Judgment and thought content normal.    Diabetic Foot Exam - Simple   Simple Foot Form Diabetic Foot exam was performed with the following findings:  Yes 06/18/2017  4:30 PM  Visual Inspection No deformities, no ulcerations, no other skin breakdown bilaterally:  Yes Sensation Testing Intact to touch and monofilament testing bilaterally:  Yes Pulse Check Posterior Tibialis and Dorsalis pulse intact bilaterally:  Yes Comments       Assessment & Plan:   [name] was seen today for diabetes.  Diagnoses and all orders for this visit:  Type 2 diabetes, uncontrolled, with neuropathy (HCC)  Dyslipidemia associated with type 2 diabetes mellitus (HCC)  Hypothyroidism, unspecified type  Other orders -     Discontinue: mirtazapine (REMERON) 30 MG tablet; Take 1 tablet (30 mg total) by mouth at bedtime. -     Discontinue: SitaGLIPtin-MetFORMIN HCl (JANUMET XR) 50-1000 MG TB24; Take 1 PO BID -     mirtazapine (REMERON) 30 MG tablet; Take 1 tablet (30 mg total) by mouth at bedtime. -     SitaGLIPtin-MetFORMIN HCl (JANUMET XR) 50-1000 MG TB24; Take 1 PO BID   I have discontinued Ms. Krista Tate's Ferrous Fumarate, metFORMIN, and citalopram. I am also having her maintain her cyclobenzaprine, atorvastatin, levothyroxine, mirtazapine, and SitaGLIPtin-MetFORMIN HCl.  Meds ordered this encounter  Medications  . levothyroxine (SYNTHROID, LEVOTHROID) 125 MCG tablet    Sig: Take 125 mcg by mouth daily.  [name]. DISCONTD: mirtazapine (REMERON) 30 MG tablet    Sig: Take 1 tablet (30 mg total) by mouth at bedtime.    Dispense:  90 tablet    Refill:  0  . DISCONTD: SitaGLIPtin-MetFORMIN HCl (JANUMET XR) 50-1000 MG TB24    Sig: Take 1 PO BID    Dispense:  180 tablet    Refill:  0  . mirtazapine (REMERON) 30 MG tablet    Sig: Take 1 tablet (30 mg total) by mouth at bedtime.     Dispense:  30 tablet    Refill:  0  . SitaGLIPtin-MetFORMIN HCl (JANUMET XR) 50-1000 MG TB24    Sig: Take 1 PO BID    Dispense:  60 tablet    Refill:  0     Follow-up: Return in about 3 months (around 09/18/2017).  Mechele ClaudeWarren Alixandria Friedt, M.D.

## 2017-07-22 ENCOUNTER — Other Ambulatory Visit: Payer: Self-pay | Admitting: Family Medicine

## 2017-09-15 DIAGNOSIS — E039 Hypothyroidism, unspecified: Secondary | ICD-10-CM | POA: Diagnosis not present

## 2017-09-15 DIAGNOSIS — D509 Iron deficiency anemia, unspecified: Secondary | ICD-10-CM | POA: Diagnosis not present

## 2017-09-15 DIAGNOSIS — E782 Mixed hyperlipidemia: Secondary | ICD-10-CM | POA: Diagnosis not present

## 2017-09-15 DIAGNOSIS — E1140 Type 2 diabetes mellitus with diabetic neuropathy, unspecified: Secondary | ICD-10-CM | POA: Diagnosis not present

## 2017-09-15 DIAGNOSIS — Z79899 Other long term (current) drug therapy: Secondary | ICD-10-CM | POA: Diagnosis not present

## 2017-09-19 ENCOUNTER — Ambulatory Visit: Payer: BLUE CROSS/BLUE SHIELD | Admitting: Family Medicine

## 2017-09-22 ENCOUNTER — Encounter: Payer: Self-pay | Admitting: Family Medicine

## 2017-09-29 DIAGNOSIS — E039 Hypothyroidism, unspecified: Secondary | ICD-10-CM | POA: Diagnosis not present

## 2017-09-29 DIAGNOSIS — E782 Mixed hyperlipidemia: Secondary | ICD-10-CM | POA: Diagnosis not present

## 2017-09-29 DIAGNOSIS — Z6832 Body mass index (BMI) 32.0-32.9, adult: Secondary | ICD-10-CM | POA: Diagnosis not present

## 2017-09-29 DIAGNOSIS — E1140 Type 2 diabetes mellitus with diabetic neuropathy, unspecified: Secondary | ICD-10-CM | POA: Diagnosis not present

## 2017-09-29 DIAGNOSIS — Z008 Encounter for other general examination: Secondary | ICD-10-CM | POA: Diagnosis not present

## 2017-10-13 ENCOUNTER — Ambulatory Visit: Payer: Self-pay | Admitting: Family Medicine

## 2017-11-14 ENCOUNTER — Encounter: Payer: Self-pay | Admitting: Pediatrics

## 2017-11-14 ENCOUNTER — Ambulatory Visit (INDEPENDENT_AMBULATORY_CARE_PROVIDER_SITE_OTHER): Payer: BLUE CROSS/BLUE SHIELD | Admitting: Pediatrics

## 2017-11-14 VITALS — BP 152/97 | HR 84 | Temp 97.6°F | Ht 66.0 in | Wt 204.2 lb

## 2017-11-14 DIAGNOSIS — R030 Elevated blood-pressure reading, without diagnosis of hypertension: Secondary | ICD-10-CM | POA: Diagnosis not present

## 2017-11-14 DIAGNOSIS — E1140 Type 2 diabetes mellitus with diabetic neuropathy, unspecified: Secondary | ICD-10-CM

## 2017-11-14 DIAGNOSIS — IMO0002 Reserved for concepts with insufficient information to code with codable children: Secondary | ICD-10-CM

## 2017-11-14 DIAGNOSIS — E1165 Type 2 diabetes mellitus with hyperglycemia: Secondary | ICD-10-CM | POA: Diagnosis not present

## 2017-11-14 DIAGNOSIS — R61 Generalized hyperhidrosis: Secondary | ICD-10-CM | POA: Diagnosis not present

## 2017-11-14 LAB — URINALYSIS, COMPLETE
BILIRUBIN UA: NEGATIVE
Glucose, UA: NEGATIVE
Ketones, UA: NEGATIVE
Nitrite, UA: NEGATIVE
Protein, UA: NEGATIVE
Specific Gravity, UA: 1.025 (ref 1.005–1.030)
UUROB: 0.2 mg/dL (ref 0.2–1.0)
pH, UA: 5.5 (ref 5.0–7.5)

## 2017-11-14 LAB — MICROSCOPIC EXAMINATION: RENAL EPITHEL UA: NONE SEEN /HPF

## 2017-11-14 NOTE — Progress Notes (Signed)
Subjective:   Patient ID: Krista Tate, female    DOB: Nov 28, 1980, 37 y.o.   MRN: [id number] CC: cold Sweats; No appetite; Fatigue; and Joint Pain  HPI: Krista Tate is a 37 y.o. female presenting for cold Sweats; No appetite; Fatigue; and Joint Pain  For past 2-3 weeks has been going to bed feeling very cold, wakes up soaking sheets, can happen several times in a night  Has had some URI symptoms off and on No sore throat, minimal nasal congestion now Slight cough at times, she doesn't think any different than usual  Works at [employer], has been able to continue working, feels tired Her joints ache but she says they usually ache this time of year when the weather gets cold No redness or swelling in her joints Her PIP L 4th finger feels sore when she is working but has not been red or swollen  Appetite has been down, she says she knows she needs to eat so she eats regular meals Has history of diabetes, has been taking her medicines regularly Sugars been slightly higher than usual, was 315 before lunch the other day  No dysuria, no stomach pain Does not have a thermometer at home, not been able to check her temperature  Says she is allergic to metal Just got off work, feeling right now  Having regular periods, just finished  Relevant past medical, surgical, family and social history reviewed. Allergies and medications reviewed and updated. Social History   Tobacco Use  Smoking Status Never Smoker  Smokeless Tobacco Never Used   ROS: Per HPI   Objective:    BP (!) 152/97   Pulse 84   Temp 97.6 F (36.4 C) (Oral)   Ht _0  (1.676 m)   Wt 204 lb 3.2 oz (92.6 kg)   BMI 32.96 kg/m   Wt Readings from Last 3 Encounters:  11/14/17 204 lb 3.2 oz (92.6 kg)  06/18/17 202 lb (91.6 kg)  01/01/17 212 lb (96.2 kg)    Gen: NAD, alert, cooperative with exam, NCAT EYES: EOMI, no conjunctival injection, or no icterus ENT:  TMs pearly gray b/l, OP without erythema LYMPH: no  cervical LAD CV: NRRR, normal S1/S2, no murmur, distal pulses 2+ b/l Resp: CTABL, no wheezes, normal WOB Abd: +BS, soft, NTND. no guarding or organomegaly Ext: No edema, warm Neuro: Alert and oriented, strength equal b/l UE and LE, coordination grossly normal MSK: normal range of motion bilateral hands, no synovitis Skin: No rash  Assessment & Plan:  Krista Tate was seen today for cold sweats, no appetite, fatigue and joint pain.  Diagnoses and all orders for this visit:  Night sweats Nonfocal exam, no focal symptoms We will start with below -     CBC with Differential/Platelet -     CMP14+EGFR -     Sedimentation rate -     C-reactive protein -     Urinalysis, Complete  Type 2 diabetes, uncontrolled, with neuropathy (HCC) Continue to check at home, needs follow-up appointment for diabetes control Let us know if blood sugars are regularly elevated over 300 Minimize sugar intake  Elevated blood pressure reading Blood pressure readings have always been normal, patient did just get off work, she is feeling stressed, she thinks that her blood pressure today.  Is not taking any medicines that raise blood pressure for cold symptoms. Check at work, follow-up in 1 week  Follow up plan: Return in about 1 week (around 11/21/2017). Assunta Found, MD [place]

## 2017-11-15 LAB — CBC WITH DIFFERENTIAL/PLATELET
BASOS ABS: 0 10*3/uL (ref 0.0–0.2)
Basos: 0 %
EOS (ABSOLUTE): 0.4 10*3/uL (ref 0.0–0.4)
Eos: 5 %
HEMOGLOBIN: 12.3 g/dL (ref 11.1–15.9)
Hematocrit: 38.2 % (ref 34.0–46.6)
IMMATURE GRANULOCYTES: 0 %
Immature Grans (Abs): 0 10*3/uL (ref 0.0–0.1)
LYMPHS ABS: 3.1 10*3/uL (ref 0.7–3.1)
LYMPHS: 36 %
MCH: 26 pg — ABNORMAL LOW (ref 26.6–33.0)
MCHC: 32.2 g/dL (ref 31.5–35.7)
MCV: 81 fL (ref 79–97)
MONOCYTES: 8 %
Monocytes Absolute: 0.7 10*3/uL (ref 0.1–0.9)
NEUTROS PCT: 51 %
Neutrophils Absolute: 4.4 10*3/uL (ref 1.4–7.0)
Platelets: 368 10*3/uL (ref 150–379)
RBC: 4.73 x10E6/uL (ref 3.77–5.28)
RDW: 14.5 % (ref 12.3–15.4)
WBC: 8.6 10*3/uL (ref 3.4–10.8)

## 2017-11-15 LAB — C-REACTIVE PROTEIN: CRP: 6.1 mg/L — AB (ref 0.0–4.9)

## 2017-11-15 LAB — CMP14+EGFR
ALBUMIN: 4.1 g/dL (ref 3.5–5.5)
ALK PHOS: 63 IU/L (ref 39–117)
ALT: 17 IU/L (ref 0–32)
AST: 16 IU/L (ref 0–40)
Albumin/Globulin Ratio: 1.4 (ref 1.2–2.2)
BUN/Creatinine Ratio: 25 — ABNORMAL HIGH (ref 9–23)
BUN: 14 mg/dL (ref 6–20)
Bilirubin Total: <0.2 mg/dL (ref 0.0–1.2)
CO2: 26 mmol/L (ref 20–29)
Calcium: 9.4 mg/dL (ref 8.7–10.2)
Chloride: 99 mmol/L (ref 96–106)
Creatinine, Ser: 0.57 mg/dL (ref 0.57–1.00)
GFR calc Af Amer: 137 mL/min/{1.73_m2} (ref >59)
GFR calc non Af Amer: 119 mL/min/{1.73_m2} (ref >59)
GLUCOSE: 174 mg/dL — AB (ref 65–99)
Globulin, Total: 3 g/dL (ref 1.5–4.5)
Potassium: 4.7 mmol/L (ref 3.5–5.2)
Sodium: 137 mmol/L (ref 134–144)
Total Protein: 7.1 g/dL (ref 6.0–8.5)

## 2017-11-15 LAB — SEDIMENTATION RATE: SED RATE: 6 mm/h (ref 0–32)

## 2017-11-25 ENCOUNTER — Other Ambulatory Visit: Payer: Self-pay | Admitting: Family Medicine

## 2017-12-17 ENCOUNTER — Encounter: Payer: Self-pay | Admitting: Family Medicine

## 2017-12-17 DIAGNOSIS — E782 Mixed hyperlipidemia: Secondary | ICD-10-CM | POA: Diagnosis not present

## 2017-12-17 DIAGNOSIS — E1140 Type 2 diabetes mellitus with diabetic neuropathy, unspecified: Secondary | ICD-10-CM | POA: Diagnosis not present

## 2017-12-17 DIAGNOSIS — E039 Hypothyroidism, unspecified: Secondary | ICD-10-CM | POA: Diagnosis not present

## 2017-12-17 DIAGNOSIS — Z008 Encounter for other general examination: Secondary | ICD-10-CM | POA: Diagnosis not present

## 2017-12-17 DIAGNOSIS — Z719 Counseling, unspecified: Secondary | ICD-10-CM | POA: Diagnosis not present

## 2017-12-17 DIAGNOSIS — D509 Iron deficiency anemia, unspecified: Secondary | ICD-10-CM | POA: Diagnosis not present

## 2017-12-26 ENCOUNTER — Other Ambulatory Visit: Payer: Self-pay | Admitting: Family

## 2018-01-06 ENCOUNTER — Ambulatory Visit: Payer: Self-pay | Admitting: Family Medicine

## 2018-01-14 DIAGNOSIS — E782 Mixed hyperlipidemia: Secondary | ICD-10-CM | POA: Diagnosis not present

## 2018-01-14 DIAGNOSIS — E039 Hypothyroidism, unspecified: Secondary | ICD-10-CM | POA: Diagnosis not present

## 2018-01-14 DIAGNOSIS — E1140 Type 2 diabetes mellitus with diabetic neuropathy, unspecified: Secondary | ICD-10-CM | POA: Diagnosis not present

## 2018-01-14 DIAGNOSIS — D509 Iron deficiency anemia, unspecified: Secondary | ICD-10-CM | POA: Diagnosis not present

## 2018-01-15 ENCOUNTER — Telehealth: Payer: Self-pay | Admitting: *Deleted

## 2018-01-15 NOTE — Telephone Encounter (Signed)
Patient aware of results and states that she will call back to schedule an appt after looking at work schedule

## 2018-01-15 NOTE — Telephone Encounter (Signed)
-----   Message from Mechele ClaudeWarren Stacks, MD sent at 01/14/2018  5:26 PM EDT ----- Please contact pt to make appt. Soon. She had lab at work showing poor control of diabetes. Thanks, WS

## 2018-02-03 ENCOUNTER — Ambulatory Visit: Payer: BLUE CROSS/BLUE SHIELD | Admitting: Family Medicine

## 2018-02-03 ENCOUNTER — Encounter: Payer: Self-pay | Admitting: Family Medicine

## 2018-02-03 VITALS — BP 130/85 | HR 92 | Ht 66.0 in | Wt 209.0 lb

## 2018-02-03 DIAGNOSIS — E1165 Type 2 diabetes mellitus with hyperglycemia: Secondary | ICD-10-CM | POA: Diagnosis not present

## 2018-02-03 DIAGNOSIS — E1169 Type 2 diabetes mellitus with other specified complication: Secondary | ICD-10-CM | POA: Diagnosis not present

## 2018-02-03 DIAGNOSIS — E039 Hypothyroidism, unspecified: Secondary | ICD-10-CM

## 2018-02-03 DIAGNOSIS — E785 Hyperlipidemia, unspecified: Secondary | ICD-10-CM

## 2018-02-03 DIAGNOSIS — E1140 Type 2 diabetes mellitus with diabetic neuropathy, unspecified: Secondary | ICD-10-CM | POA: Diagnosis not present

## 2018-02-03 DIAGNOSIS — IMO0002 Reserved for concepts with insufficient information to code with codable children: Secondary | ICD-10-CM

## 2018-02-03 MED ORDER — METFORMIN HCL 1000 MG PO TABS: ORAL_TABLET | ORAL | 5 refills | Status: DC

## 2018-02-03 MED ORDER — DULAGLUTIDE 0.75 MG/0.5ML ~~LOC~~ SOAJ: SUBCUTANEOUS | 5 refills | Status: DC

## 2018-02-03 MED ORDER — CITALOPRAM HYDROBROMIDE 20 MG PO TABS: 20.0000 mg | ORAL_TABLET | Freq: Every day | ORAL | 5 refills | Status: DC

## 2018-02-03 NOTE — Progress Notes (Signed)
Subjective:  Patient ID: Krista Tate, female    DOB: December 08, 1980  Age: 37 y.o. MRN: [id number]  CC: Diabetes (pt here today for routine follow up of her diabetes)   HPI Krista Tate presents forFollow-up of diabetes.  Has not been recently checking her blood sugar.  She is off of her diet.  She needs to get reestablished.  She brought in blood work about a month to 6 weeks ago and was asked to follow-up in the office to review that.  Of note is that at that time her hemoglobin A1c was approximately 10.  She has not changed any exercise or diet regimen during the last 6 weeks since that information was given to her at her workplace.  Patient states that she is too busy to follow a diet perform regular exercise or come to the office for her checkups.  Patient denies symptoms such as polyuria, polydipsia, excessive hunger, nausea No significant hypoglycemic spells noted. Medications reviewed. Pt reports taking them regularly without complication/adverse reaction being reported today.  She would like for her friend to try Trulicity as well.She has a friend with her who works at [employer] [facility] and is aware that I have prescribed Trulicity a lot and she suggests that I prescribe this for her friend. Krista Tate. Last eye appt was remote  History Krista Tate has a past medical history of Depression, Diabetes mellitus without complication (HCC), Hyperlipidemia, and Thyroid disease.   She has no past surgical history on file.   Her family history includes Bipolar disorder in her sister; Healthy in her brother, father, mother, sister, and sister.She reports that she has never smoked. She has never used smokeless tobacco. She reports that she does not drink alcohol or use drugs.  Current Outpatient Medications on File Prior to Visit  Medication Sig Dispense Refill  . atorvastatin (LIPITOR) 20 MG tablet TAKE 1 TABLET BY MOUTH  DAILY 60 tablet 5  . cyclobenzaprine (FLEXERIL) 10 MG tablet TAKE 1 TABLET  BY MOUTH AT BEDTIME AS NEEDED FOR SHOULDER SPASMS OR MAY TAKE 1 UP TO 3 TIMES A DAY AS NEEDED FOR HEADACHE 90 tablet 5  . levothyroxine (SYNTHROID, LEVOTHROID) 125 MCG tablet Take 125 mcg by mouth daily.     No current facility-administered medications on file prior to visit.     ROS Review of Systems  Constitutional: Negative.   HENT: Negative for congestion.   Eyes: Negative for visual disturbance.  Respiratory: Negative for shortness of breath.   Cardiovascular: Negative for chest pain.  Gastrointestinal: Negative for abdominal pain, constipation, diarrhea, nausea and vomiting.  Genitourinary: Negative for difficulty urinating.  Musculoskeletal: Negative for arthralgias and myalgias.  Neurological: Negative for headaches.  Psychiatric/Behavioral: Negative for sleep disturbance.    Objective:  BP 130/85   Pulse 92   Ht 5\' 6"  (1.676 m)   Wt 209 lb (94.8 kg)   BMI 33.73 kg/m   BP Readings from Last 3 Encounters:  02/03/18 130/85  11/14/17 (!) 152/97  06/18/17 129/80    Wt Readings from Last 3 Encounters:  02/03/18 209 lb (94.8 kg)  11/14/17 204 lb 3.2 oz (92.6 kg)  06/18/17 202 lb (91.6 kg)     Physical Exam  Constitutional: She is oriented to person, place, and time. She appears well-developed and well-nourished. No distress.  HENT:  Head: Normocephalic and atraumatic.  Right Ear: External ear normal.  Left Ear: External ear normal.  Neck: Normal range of motion. Neck supple.  Cardiovascular: Normal rate, regular  rhythm and normal heart sounds.  No murmur heard. Pulmonary/Chest: Effort normal and breath sounds normal. No respiratory distress. She has no wheezes. She has no rales.  Abdominal: Soft. There is no tenderness.  Musculoskeletal: Normal range of motion. She exhibits no edema or tenderness.  Neurological: She is alert and oriented to person, place, and time. She exhibits normal muscle tone. Coordination normal.  Skin: Skin is warm and dry.  Psychiatric:  She has a normal mood and affect. Her behavior is normal.      Assessment & Plan:   Alvera was seen today for diabetes.  Diagnoses and all orders for this visit:  Type 2 diabetes, uncontrolled, with neuropathy (HCC)  Dyslipidemia associated with type 2 diabetes mellitus (HCC)  Hypothyroidism, unspecified type  Other orders -     Dulaglutide (TRULICITY) 0.75 MG/0.5ML SOPN; Inject into the skin SubQ once a week. -     citalopram (CELEXA) 20 MG tablet; Take 1 tablet (20 mg total) by mouth daily. -     metFORMIN (GLUCOPHAGE) 1000 MG tablet; TAKE 1 TABLET BY MOUTH TWO  TIMES DAILY WITH MEALS      I have discontinued Sagan Graeff's ENGERIX-B and FLUZONE QUADRIVALENT. I have also changed her citalopram. Additionally, I am having her start on Dulaglutide. Lastly, I am having her maintain her cyclobenzaprine, levothyroxine, atorvastatin, and metFORMIN.  Meds ordered this encounter  Medications  . Dulaglutide (TRULICITY) 0.75 MG/0.5ML SOPN    Sig: Inject into the skin SubQ once a week.    Dispense:  4 pen    Refill:  5  . citalopram (CELEXA) 20 MG tablet    Sig: Take 1 tablet (20 mg total) by mouth daily.    Dispense:  30 tablet    Refill:  5  . metFORMIN (GLUCOPHAGE) 1000 MG tablet    Sig: TAKE 1 TABLET BY MOUTH TWO  TIMES DAILY WITH MEALS    Dispense:  60 tablet    Refill:  5   Discussion of diabetic complications occurring from elevated A1c and coaching regarding diet and exercise as well as upgrading her treatment was performed today in excess of 30 minutes.  Follow-up: Return in about 1 month (around 03/05/2018).  Mechele Claude, M.D.

## 2018-02-03 NOTE — Patient Instructions (Signed)

## 2018-02-08 ENCOUNTER — Encounter: Payer: Self-pay | Admitting: Family Medicine

## 2018-03-06 ENCOUNTER — Ambulatory Visit: Payer: BLUE CROSS/BLUE SHIELD | Admitting: Family Medicine

## 2018-03-09 ENCOUNTER — Encounter: Payer: Self-pay | Admitting: Family Medicine

## 2018-03-25 ENCOUNTER — Other Ambulatory Visit: Payer: Self-pay | Admitting: Family Medicine

## 2018-03-25 ENCOUNTER — Other Ambulatory Visit: Payer: Self-pay | Admitting: *Deleted

## 2018-03-25 MED ORDER — METFORMIN HCL 1000 MG PO TABS: ORAL_TABLET | ORAL | 1 refills | Status: DC

## 2018-03-25 NOTE — Telephone Encounter (Signed)
Dr. Darlyn Read sent in Glucophage #60 w/ 5 RFs to [facility] on 02/03/18 Sent 90 day supply w/ 1 RF to [facility]

## 2018-04-22 DIAGNOSIS — Z719 Counseling, unspecified: Secondary | ICD-10-CM | POA: Diagnosis not present

## 2018-04-22 DIAGNOSIS — E039 Hypothyroidism, unspecified: Secondary | ICD-10-CM | POA: Diagnosis not present

## 2018-04-22 DIAGNOSIS — E1140 Type 2 diabetes mellitus with diabetic neuropathy, unspecified: Secondary | ICD-10-CM | POA: Diagnosis not present

## 2018-04-22 DIAGNOSIS — F329 Major depressive disorder, single episode, unspecified: Secondary | ICD-10-CM | POA: Diagnosis not present

## 2018-04-22 DIAGNOSIS — E782 Mixed hyperlipidemia: Secondary | ICD-10-CM | POA: Diagnosis not present

## 2018-04-22 DIAGNOSIS — Z008 Encounter for other general examination: Secondary | ICD-10-CM | POA: Diagnosis not present

## 2018-04-27 DIAGNOSIS — E039 Hypothyroidism, unspecified: Secondary | ICD-10-CM | POA: Diagnosis not present

## 2018-04-27 DIAGNOSIS — E782 Mixed hyperlipidemia: Secondary | ICD-10-CM | POA: Diagnosis not present

## 2018-04-27 DIAGNOSIS — E1140 Type 2 diabetes mellitus with diabetic neuropathy, unspecified: Secondary | ICD-10-CM | POA: Diagnosis not present

## 2018-04-27 DIAGNOSIS — D509 Iron deficiency anemia, unspecified: Secondary | ICD-10-CM | POA: Diagnosis not present

## 2018-05-15 ENCOUNTER — Ambulatory Visit: Payer: BLUE CROSS/BLUE SHIELD | Admitting: Pediatrics

## 2018-05-21 ENCOUNTER — Encounter: Payer: Self-pay | Admitting: Pediatrics

## 2018-05-21 ENCOUNTER — Ambulatory Visit: Payer: BLUE CROSS/BLUE SHIELD | Admitting: Pediatrics

## 2018-05-21 ENCOUNTER — Ambulatory Visit (INDEPENDENT_AMBULATORY_CARE_PROVIDER_SITE_OTHER): Payer: BLUE CROSS/BLUE SHIELD

## 2018-05-21 VITALS — BP 134/92 | HR 94 | Temp 98.0°F | Ht 66.0 in | Wt 199.4 lb

## 2018-05-21 DIAGNOSIS — M25511 Pain in right shoulder: Secondary | ICD-10-CM

## 2018-05-21 DIAGNOSIS — E119 Type 2 diabetes mellitus without complications: Secondary | ICD-10-CM

## 2018-05-21 MED ORDER — GLIMEPIRIDE 2 MG PO TABS: 2.0000 mg | ORAL_TABLET | Freq: Every day | ORAL | 3 refills | Status: DC

## 2018-05-21 MED ORDER — SITAGLIPTIN PHOSPHATE 100 MG PO TABS: 100.0000 mg | ORAL_TABLET | Freq: Every day | ORAL | 1 refills | Status: DC

## 2018-05-21 NOTE — Progress Notes (Signed)
  Subjective:   Patient ID: Krista Tate, female    DOB: 08-12-1981, 37 y.o.   MRN: [id number] CC: Shoulder Pain (Right, Sharp, shooting pain, numbness, worsened over the past two months)  HPI: Krista Tate is a 37 y.o. female   R shoulder pain--hurts much of the time.  Especially at work when pushing, holding arm to side.  Right-handed  DM2: Drinking some sugary drinks regularly, soda, sweet tea.  Open to cutting back.    Relevant past medical, surgical, family and social history reviewed. Allergies and medications reviewed and updated. Social History   Tobacco Use  Smoking Status Never Smoker  Smokeless Tobacco Never Used   ROS: Per HPI   Objective:    BP (!) 134/92   Pulse 94   Temp 98 F (36.7 C) (Oral)   Ht 5\' 6"  (1.676 m)   Wt 199 lb 6.4 oz (90.4 kg)   BMI 32.18 kg/m   Wt Readings from Last 3 Encounters:  05/21/18 199 lb 6.4 oz (90.4 kg)  02/03/18 209 lb (94.8 kg)  11/14/17 204 lb 3.2 oz (92.6 kg)    Gen: NAD, alert, cooperative with exam, NCAT EYES: EOMI, no conjunctival injection, or no icterus ENT: OP without erythema LYMPH: no cervical LAD CV: NRRR, normal S1/S2, no murmur, distal pulses 2+ b/l Resp: CTABL, no wheezes, normal WOB Ext: No edema, warm Neuro: Alert and oriented MSK: Pain with reaching behind her back.  Normal range of motion right shoulder.  Rotator cuff muscles intact tenderness anterior shoulder.  Assessment & Plan:  Krista Tate was seen today for shoulder pain.  Diagnoses and all orders for this visit:  Diabetes mellitus without complication (HCC) Not well controlled.  Has blood work with her from work.  Elevated A1c.  Start Januvia. -     sitaGLIPtin (JANUVIA) 100 MG tablet; Take 1 tablet (100 mg total) by mouth daily. -     glimepiride (AMARYL) 2 MG tablet; Take 1 tablet (2 mg total) by mouth daily before breakfast.  Right shoulder pain, unspecified chronicity Rest, NSAIDs as needed.  Referred to physical therapy.  If not improving let  me know. -     Ambulatory referral to Physical Therapy -     DG Shoulder Right; Future   Follow up plan: Return in about 2 months (around 07/31/2018). Rex Krasarol Vincent, MD [facility]

## 2018-06-04 DIAGNOSIS — M5412 Radiculopathy, cervical region: Secondary | ICD-10-CM | POA: Diagnosis not present

## 2018-06-04 DIAGNOSIS — M25511 Pain in right shoulder: Secondary | ICD-10-CM | POA: Diagnosis not present

## 2018-06-04 DIAGNOSIS — M79601 Pain in right arm: Secondary | ICD-10-CM | POA: Diagnosis not present

## 2018-06-04 DIAGNOSIS — F419 Anxiety disorder, unspecified: Secondary | ICD-10-CM | POA: Diagnosis not present

## 2018-06-08 DIAGNOSIS — M79601 Pain in right arm: Secondary | ICD-10-CM | POA: Diagnosis not present

## 2018-06-08 DIAGNOSIS — F419 Anxiety disorder, unspecified: Secondary | ICD-10-CM | POA: Diagnosis not present

## 2018-06-08 DIAGNOSIS — M5412 Radiculopathy, cervical region: Secondary | ICD-10-CM | POA: Diagnosis not present

## 2018-06-08 DIAGNOSIS — M25511 Pain in right shoulder: Secondary | ICD-10-CM | POA: Diagnosis not present

## 2018-07-09 ENCOUNTER — Other Ambulatory Visit: Payer: Self-pay | Admitting: *Deleted

## 2018-07-09 DIAGNOSIS — E119 Type 2 diabetes mellitus without complications: Secondary | ICD-10-CM

## 2018-07-09 MED ORDER — GLIMEPIRIDE 2 MG PO TABS: 2.0000 mg | ORAL_TABLET | Freq: Every day | ORAL | 0 refills | Status: DC

## 2018-07-20 DIAGNOSIS — E559 Vitamin D deficiency, unspecified: Secondary | ICD-10-CM | POA: Diagnosis not present

## 2018-07-20 DIAGNOSIS — E782 Mixed hyperlipidemia: Secondary | ICD-10-CM | POA: Diagnosis not present

## 2018-07-20 DIAGNOSIS — E1140 Type 2 diabetes mellitus with diabetic neuropathy, unspecified: Secondary | ICD-10-CM | POA: Diagnosis not present

## 2018-07-20 DIAGNOSIS — Z79899 Other long term (current) drug therapy: Secondary | ICD-10-CM | POA: Diagnosis not present

## 2018-07-20 DIAGNOSIS — Z139 Encounter for screening, unspecified: Secondary | ICD-10-CM | POA: Diagnosis not present

## 2018-07-20 DIAGNOSIS — E039 Hypothyroidism, unspecified: Secondary | ICD-10-CM | POA: Diagnosis not present

## 2018-07-20 DIAGNOSIS — Z0130 Encounter for examination of blood pressure without abnormal findings: Secondary | ICD-10-CM | POA: Diagnosis not present

## 2018-07-20 DIAGNOSIS — D509 Iron deficiency anemia, unspecified: Secondary | ICD-10-CM | POA: Diagnosis not present

## 2018-08-03 ENCOUNTER — Encounter: Payer: Self-pay | Admitting: Pediatrics

## 2018-08-03 ENCOUNTER — Ambulatory Visit: Payer: BLUE CROSS/BLUE SHIELD | Admitting: Pediatrics

## 2018-08-03 VITALS — BP 131/86 | HR 92 | Temp 98.4°F | Ht 66.0 in | Wt 207.6 lb

## 2018-08-03 DIAGNOSIS — Z23 Encounter for immunization: Secondary | ICD-10-CM

## 2018-08-03 DIAGNOSIS — E1169 Type 2 diabetes mellitus with other specified complication: Secondary | ICD-10-CM | POA: Diagnosis not present

## 2018-08-03 DIAGNOSIS — R030 Elevated blood-pressure reading, without diagnosis of hypertension: Secondary | ICD-10-CM | POA: Diagnosis not present

## 2018-08-03 DIAGNOSIS — E039 Hypothyroidism, unspecified: Secondary | ICD-10-CM

## 2018-08-03 DIAGNOSIS — E1140 Type 2 diabetes mellitus with diabetic neuropathy, unspecified: Secondary | ICD-10-CM

## 2018-08-03 DIAGNOSIS — R2242 Localized swelling, mass and lump, left lower limb: Secondary | ICD-10-CM

## 2018-08-03 DIAGNOSIS — IMO0002 Reserved for concepts with insufficient information to code with codable children: Secondary | ICD-10-CM

## 2018-08-03 DIAGNOSIS — Z719 Counseling, unspecified: Secondary | ICD-10-CM | POA: Diagnosis not present

## 2018-08-03 DIAGNOSIS — E785 Hyperlipidemia, unspecified: Secondary | ICD-10-CM

## 2018-08-03 DIAGNOSIS — E1165 Type 2 diabetes mellitus with hyperglycemia: Secondary | ICD-10-CM

## 2018-08-03 DIAGNOSIS — E782 Mixed hyperlipidemia: Secondary | ICD-10-CM | POA: Diagnosis not present

## 2018-08-03 MED ORDER — ATORVASTATIN CALCIUM 40 MG PO TABS: 40.0000 mg | ORAL_TABLET | Freq: Every day | ORAL | 3 refills | Status: DC

## 2018-08-03 NOTE — Progress Notes (Signed)
  Subjective:   Patient ID: Krista Tate, female    DOB: 1980/11/07, 37 y.o.   MRN: [id number] CC: Medical Management of Chronic Issues  HPI: Krista Tate is a 37 y.o. female   Diabetes: Taking glimepiride once a day at night.  Sugars have been better.  Has not yet started Januvia.  Most recent A1c from work was 8.4 on 9/16.   Elevated blood pressures: Says at work usually blood pressures on top in the 120s.  Foot not: Has been there for months, thinks it is getting slightly bigger because now it is more noticeable.  On the arch of her foot, she notices it at work.  Hyperlipidemia: Taking atorvastatin 20 mg daily, triglyceride level at work 213, total 164, HDL 40, LDL 93 on 07/20/2018.  Relevant past medical, surgical, family and social history reviewed. Allergies and medications reviewed and updated. Social History   Tobacco Use  Smoking Status Never Smoker  Smokeless Tobacco Never Used   ROS: Per HPI   Objective:    BP 131/86   Pulse 92   Temp 98.4 F (36.9 C) (Oral)   Ht 5\' 6"  (1.676 m)   Wt 207 lb 9.6 oz (94.2 kg)   BMI 33.51 kg/m   Wt Readings from Last 3 Encounters:  08/03/18 207 lb 9.6 oz (94.2 kg)  05/21/18 199 lb 6.4 oz (90.4 kg)  02/03/18 209 lb (94.8 kg)    Gen: NAD, alert, cooperative with exam, NCAT EYES: EOMI, no conjunctival injection, or no icterus ENT:  TMs pearly gray b/l, OP without erythema LYMPH: no cervical LAD CV: NRRR, normal S1/S2, no murmur, distal pulses 2+ b/l Resp: CTABL, no wheezes, normal WOB Ext: No edema, warm Neuro: Alert and oriented, strength equal b/l UE and LE, coordination grossly normal Skin: Approximately 1 cm nodule under the skin of the left arch, not very mobile.  Mildly tender.  No fluctuance, slightly pink.   Assessment & Plan:  Jonny was seen today for medical management of chronic issues.  Diagnoses and all orders for this visit:  Type 2 diabetes, uncontrolled, with neuropathy (HCC) A1c 8.4, improved from 12.5  alone.  Also was cut back on sugary sodas.  Continue to make dietary changes.  Start Januvia.  Dyslipidemia associated with type 2 diabetes mellitus (HCC) Increase atorvastatin to 40 mg -     atorvastatin (LIPITOR) 40 MG tablet; Take 1 tablet (40 mg total) by mouth daily.  Hypothyroidism, unspecified type TSH most recently 3.27, at goal.  Continue current medicines  Elevated blood pressure reading Elevated today, continue to follow outside office visit.  Usually blood pressures are better.  Foot mass, left -     Ambulatory referral to Podiatry   Follow up plan: Return in about 3 months (around 11/02/2018).  Due for Pap smear/well visit next visit. Rex Kras, MD [facility]

## 2018-08-04 ENCOUNTER — Other Ambulatory Visit: Payer: Self-pay | Admitting: Pediatrics

## 2018-08-04 ENCOUNTER — Other Ambulatory Visit: Payer: Self-pay | Admitting: Family Medicine

## 2018-08-04 DIAGNOSIS — E119 Type 2 diabetes mellitus without complications: Secondary | ICD-10-CM

## 2018-08-13 DIAGNOSIS — M722 Plantar fascial fibromatosis: Secondary | ICD-10-CM | POA: Diagnosis not present

## 2018-08-18 ENCOUNTER — Other Ambulatory Visit: Payer: Self-pay | Admitting: Pediatrics

## 2018-08-18 DIAGNOSIS — E119 Type 2 diabetes mellitus without complications: Secondary | ICD-10-CM

## 2018-09-03 ENCOUNTER — Other Ambulatory Visit: Payer: Self-pay | Admitting: Pediatrics

## 2018-09-03 DIAGNOSIS — E119 Type 2 diabetes mellitus without complications: Secondary | ICD-10-CM

## 2018-09-28 DIAGNOSIS — Z029 Encounter for administrative examinations, unspecified: Secondary | ICD-10-CM

## 2018-10-22 ENCOUNTER — Other Ambulatory Visit: Payer: Self-pay | Admitting: Pediatrics

## 2018-10-22 DIAGNOSIS — E119 Type 2 diabetes mellitus without complications: Secondary | ICD-10-CM

## 2018-12-16 ENCOUNTER — Other Ambulatory Visit: Payer: Self-pay | Admitting: Pediatrics

## 2018-12-17 ENCOUNTER — Encounter: Payer: Self-pay | Admitting: *Deleted

## 2018-12-17 NOTE — Telephone Encounter (Signed)
Former Surveyor, quantity. NTBS mail order request. Last OV 08/03/18

## 2019-01-01 ENCOUNTER — Other Ambulatory Visit: Payer: Self-pay | Admitting: Pediatrics

## 2019-01-04 NOTE — Telephone Encounter (Signed)
Prior [place]. NTBS Was due 11/02/18.  Mail order request.

## 2019-01-04 NOTE — Telephone Encounter (Signed)
lmtcb-cb 3/2

## 2019-03-16 IMAGING — DX DG SHOULDER 2+V*R*
3 series · 3 of 3 positions shown · non-contrast
Comparison: None.

CLINICAL DATA: Right shoulder pain with crunching noises with
movement

EXAM:
RIGHT SHOULDER - 2+ VIEW

[shoulder ap]
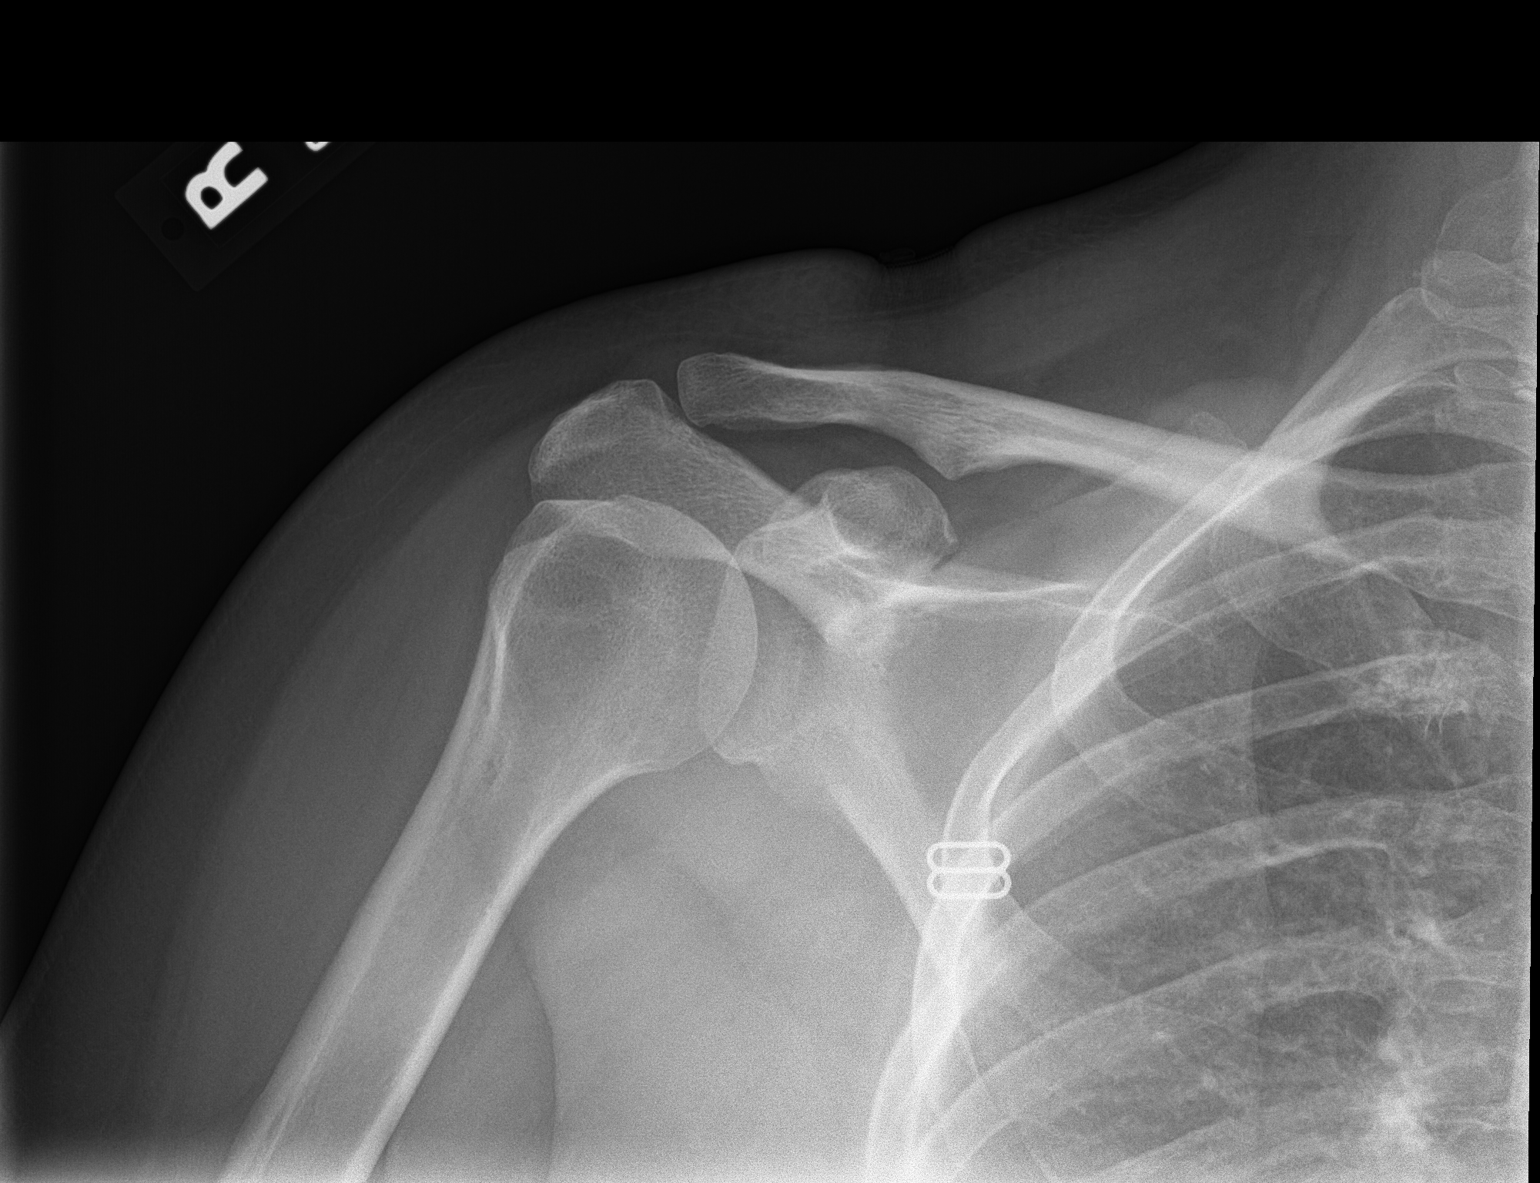

[shoulder obl]
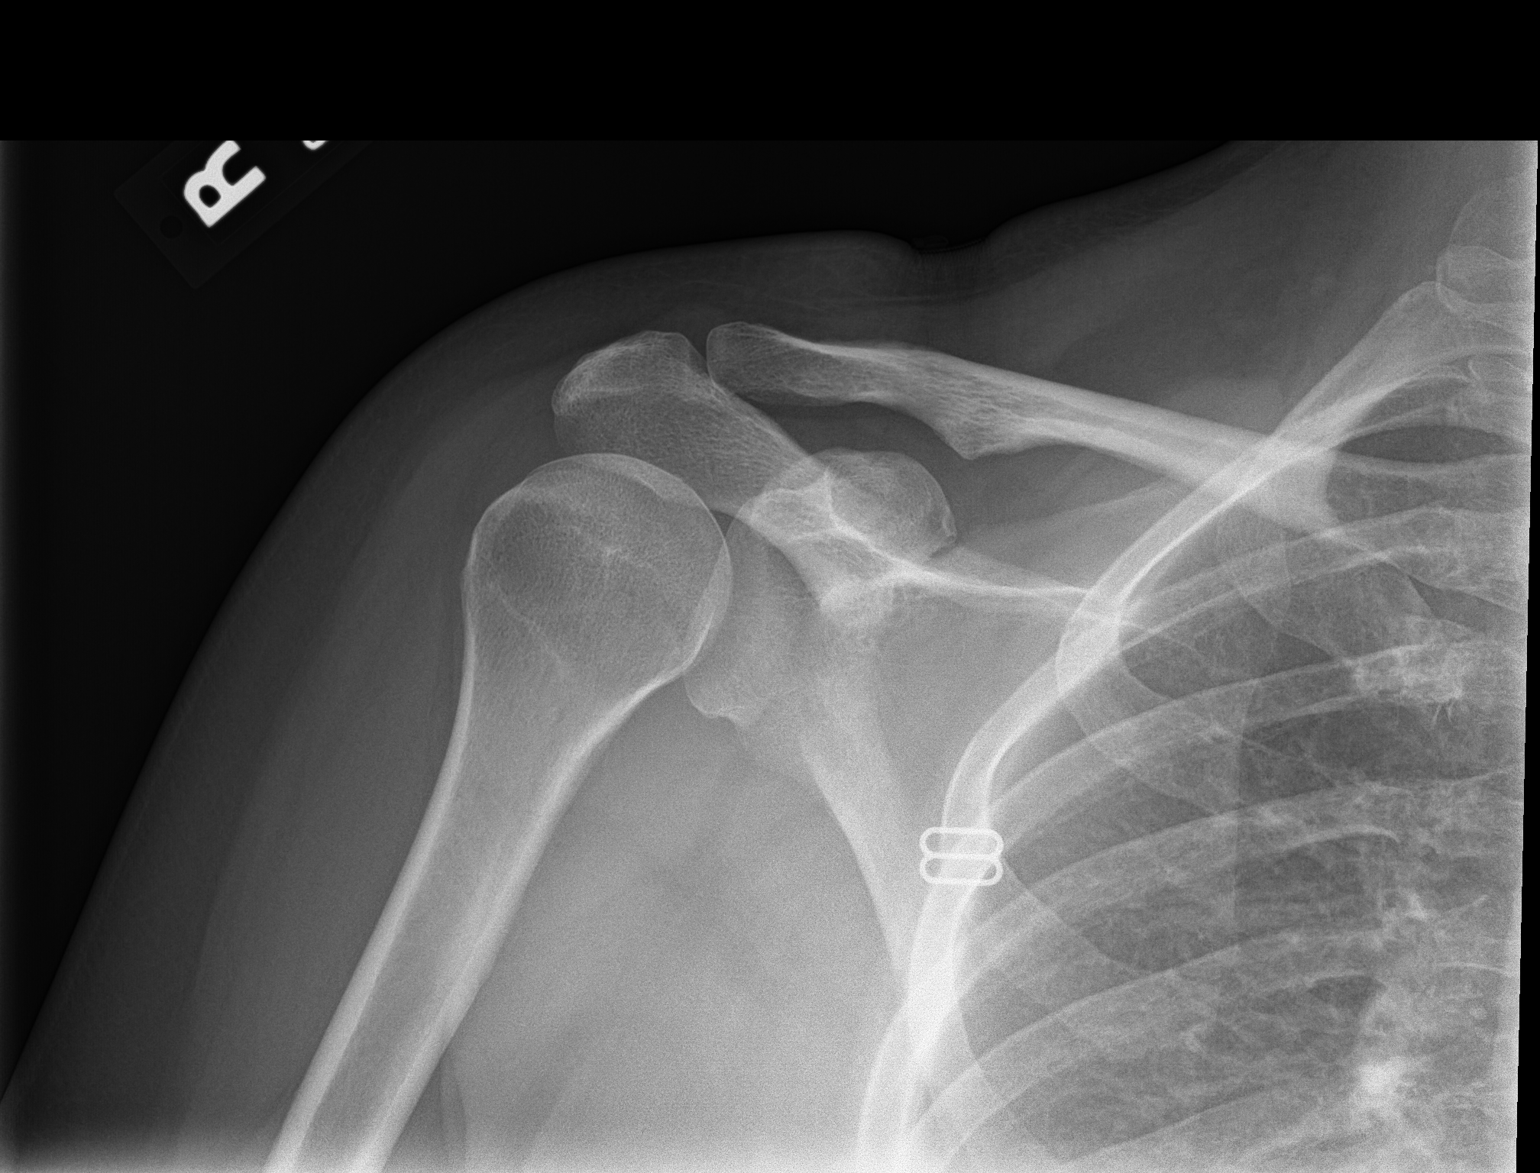

[shoulder axial]
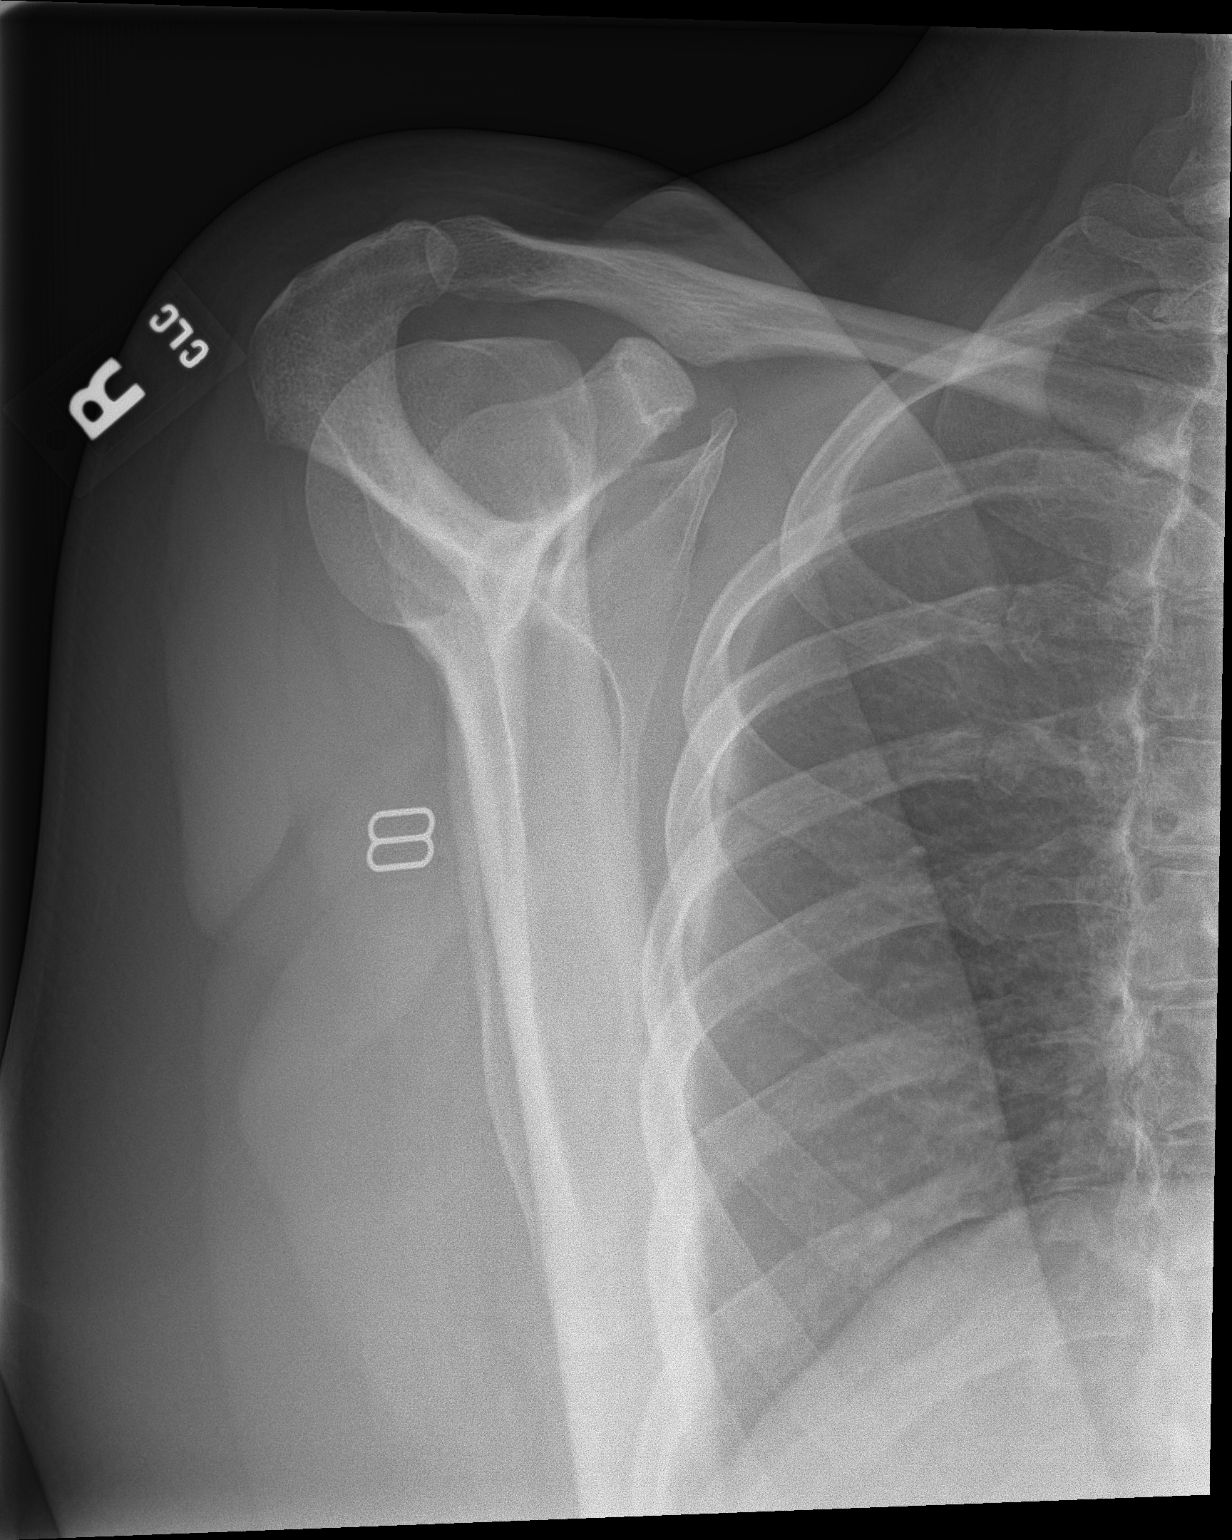

[3 of 3 positions shown; findings below may reference images not displayed]

FINDINGS: The right humeral head is in normal position. The glenohumeral joint
space appears normal. There is a somewhat downward sloping acromion
with mild spurring which can predispose to impingement and possibly
chronic rotator cuff disease. The right AC joint is normally
aligned.
IMPRESSION: 1. No acute abnormality.
2. Downward sloping acromion with mild spurring. Possible
impingement.

## 2019-05-16 ENCOUNTER — Other Ambulatory Visit: Payer: Self-pay | Admitting: Family Medicine

## 2019-05-20 ENCOUNTER — Other Ambulatory Visit: Payer: Self-pay | Admitting: Family Medicine

## 2019-05-20 DIAGNOSIS — E119 Type 2 diabetes mellitus without complications: Secondary | ICD-10-CM

## 2019-05-20 NOTE — Telephone Encounter (Signed)
Refusal sent to pharmacy.  Patient needs to be seen

## 2019-05-21 NOTE — Telephone Encounter (Signed)
Multiple attempts made to contact patient.  Left message stating patient will need to be seen for refills on medication and to call this office to set up an appt

## 2019-07-26 ENCOUNTER — Other Ambulatory Visit: Payer: Self-pay | Admitting: *Deleted

## 2021-05-29 ENCOUNTER — Other Ambulatory Visit (HOSPITAL_COMMUNITY): Payer: Self-pay | Admitting: Nurse Practitioner

## 2021-05-29 ENCOUNTER — Other Ambulatory Visit: Payer: Self-pay | Admitting: Nurse Practitioner

## 2021-05-29 DIAGNOSIS — Z1231 Encounter for screening mammogram for malignant neoplasm of breast: Secondary | ICD-10-CM

## 2021-05-29 DIAGNOSIS — T8332XA Displacement of intrauterine contraceptive device, initial encounter: Secondary | ICD-10-CM

## 2021-06-25 ENCOUNTER — Ambulatory Visit (HOSPITAL_COMMUNITY)
Admission: RE | Admit: 2021-06-25 | Discharge: 2021-06-25 | Disposition: A | Discharge status: Home / Self Care | Payer: Self-pay | Source: Ambulatory Visit | Attending: Nurse Practitioner | Admitting: Nurse Practitioner

## 2021-06-25 ENCOUNTER — Other Ambulatory Visit: Payer: Self-pay

## 2021-06-25 DIAGNOSIS — Z1231 Encounter for screening mammogram for malignant neoplasm of breast: Secondary | ICD-10-CM | POA: Insufficient documentation

## 2021-06-25 DIAGNOSIS — T8332XA Displacement of intrauterine contraceptive device, initial encounter: Secondary | ICD-10-CM | POA: Insufficient documentation

## 2021-07-17 DIAGNOSIS — A630 Anogenital (venereal) warts: Secondary | ICD-10-CM | POA: Diagnosis not present

## 2021-07-31 ENCOUNTER — Encounter: Payer: Self-pay | Admitting: Women's Health

## 2021-08-02 ENCOUNTER — Encounter: Payer: Self-pay | Admitting: Obstetrics & Gynecology

## 2021-08-21 ENCOUNTER — Encounter: Payer: Self-pay | Admitting: Adult Health

## 2021-08-21 ENCOUNTER — Ambulatory Visit (INDEPENDENT_AMBULATORY_CARE_PROVIDER_SITE_OTHER): Payer: BC Managed Care – PPO | Admitting: Adult Health

## 2021-08-21 ENCOUNTER — Other Ambulatory Visit: Payer: Self-pay

## 2021-08-21 VITALS — BP 158/91 | HR 110 | Ht 64.0 in | Wt 204.5 lb

## 2021-08-21 DIAGNOSIS — E1159 Type 2 diabetes mellitus with other circulatory complications: Secondary | ICD-10-CM | POA: Insufficient documentation

## 2021-08-21 DIAGNOSIS — Z975 Presence of (intrauterine) contraceptive device: Secondary | ICD-10-CM

## 2021-08-21 DIAGNOSIS — Z538 Procedure and treatment not carried out for other reasons: Secondary | ICD-10-CM

## 2021-08-21 DIAGNOSIS — F32A Depression, unspecified: Secondary | ICD-10-CM

## 2021-08-21 DIAGNOSIS — F419 Anxiety disorder, unspecified: Secondary | ICD-10-CM

## 2021-08-21 DIAGNOSIS — I10 Essential (primary) hypertension: Secondary | ICD-10-CM | POA: Diagnosis not present

## 2021-08-21 DIAGNOSIS — T8332XA Displacement of intrauterine contraceptive device, initial encounter: Secondary | ICD-10-CM

## 2021-08-21 MED ORDER — DOXYCYCLINE HYCLATE 100 MG PO TABS: 100.0000 mg | ORAL_TABLET | Freq: Two times a day (BID) | ORAL | 0 refills | Status: DC

## 2021-08-21 MED ORDER — LISINOPRIL 10 MG PO TABS: 10.0000 mg | ORAL_TABLET | Freq: Every day | ORAL | 3 refills | Status: DC

## 2021-08-21 MED ORDER — CITALOPRAM HYDROBROMIDE 20 MG PO TABS: 20.0000 mg | ORAL_TABLET | Freq: Every day | ORAL | 3 refills | Status: DC

## 2021-08-21 NOTE — Progress Notes (Addendum)
Subjective:     Patient ID: Krista Tate, female   DOB: July 17, 1981, 40 y.o.   MRN: [id number]  HPI Krista Tate is a 40 year old white female, single with SO, G1P0010, in for IUD removal. She was seen at health dept and found to have no strings, and they could not remove, so had [place] at [facility] and IUD is malpositioned and embedded.  IUD abnormal in position at the lower uterine segment extending into cervix with the LEFT limb appearing to penetrate deep into LEFT lateral myometrial wall. PCP is [facility], but going back to [place].  Review of Systems For IUD removal IUD is malpositioned Reviewed past medical,surgical, social and family history. Reviewed medications and allergies.     Objective:   Physical Exam BP (!) 158/91 (BP Location: Left Arm, Cuff Size: Large)   Pulse (!) 110   Ht 5\' 4"  (1.626 m)   Wt 204 lb 8 oz (92.8 kg)   LMP 08/04/2021 (Approximate)   BMI 35.10 kg/m  Consent signed for IUD removal.   Skin warm and dry.  Lungs: clear to ausculation bilaterally. Cardiovascular: regular rate and rhythm. Pelvic: external genitalia is normal in appearance no lesions, vagina is pink and moist,urethra has no lesions or masses noted, cervix:smooth,no IUD strings, tried to remove IUD with forceps, could not, Dr 10/04/2021 in and attempted and could not, will take to OR to remove. AA is 0 Fall risk is low Depression screen [facility] 2/9 08/21/2021 08/03/2018 05/21/2018  Decreased Interest 1 0 0  Down, Depressed, Hopeless 1 0 0  PHQ - 2 Score 2 0 0  Altered sleeping 3 - -  Tired, decreased energy 3 - -  Change in appetite 1 - -  Feeling bad or failure about yourself  1 - -  Trouble concentrating 2 - -  Moving slowly or fidgety/restless 0 - -  Suicidal thoughts 1 - -  PHQ-9 Score 13 - -  Difficult doing work/chores - - -   On celexa, needs refills GAD 7 : Generalized Anxiety Score 08/21/2021 06/05/2016 11/02/2015  Nervous, Anxious, on Edge 2 2 1   Control/stop worrying 2 3 3   Worry too  much - different things 2 3 3   Trouble relaxing 2 3 3   Restless 3 3 2   Easily annoyed or irritable 2 3 2   Afraid - awful might happen 1 2 1   Total GAD 7 Score 14 19 15   Anxiety Difficulty - Somewhat difficult Somewhat difficult      Upstream - 08/21/21 1609       Pregnancy Intention Screening   Does the patient want to become pregnant in the next year? No    Does the patient's partner want to become pregnant in the next year? No    Would the patient like to discuss contraceptive options today? No      Contraception Wrap Up   Current Method IUD or IUS    End Method Abstinence    Contraception Counseling Provided No            Examination chaperoned by LPN  Assessment:     1. Malpositioned intrauterine device (IUD), initial encounter   2. Attempted IUD removal, unsuccessful Will rx doxycycline 100 mg 1 bid for 3 days Can use tylenol or advil, and can use heating pad if needed for cramps  3. Hypertension, unspecified type,new diagnosis Decrease salt and sugars Will start on lisinopril  Follow up with [facility]  Meds ordered this encounter  Medications   doxycycline (VIBRA-TABS) 100 MG tablet    Sig: Take 1 tablet (100 mg total) by mouth 2 (two) times daily.    Dispense:  6 tablet    Refill:  0    Order Specific Question:   Supervising Provider    Answer:   Despina Hidden, LUTHER H [[id number]]   lisinopril (ZESTRIL) 10 MG tablet    Sig: Take 1 tablet (10 mg total) by mouth daily.    Dispense:  30 tablet    Refill:  3    Order Specific Question:   Supervising Provider    Answer:   Duane Lope H [[id number]]   citalopram (CELEXA) 20 MG tablet    Sig: Take 1 tablet (20 mg total) by mouth daily.    Dispense:  30 tablet    Refill:  3    Order Specific Question:   Supervising Provider    Answer:   Despina Hidden, LUTHER H [[id number]]     4. Anxiety and depression Will refill celexa 20 mg daily for her    Plan:     Toney Reil will call with OR schedule and pre op and post op  appt.

## 2021-08-24 NOTE — Progress Notes (Signed)
   GYN VISIT Patient name: Krista Tate MRN [id number]  Date of birth: 29-May-1981 Chief Complaint:   No chief complaint on file.  History of Present Illness:   Krista Tate is a 40 y.o. G10P0010  female being seen today for preop exam for upcoming hysteroscopy, IUD removal.  IUD migration: Chart reviewed- seen by J.Griffin and I attempted IUD removal in office; however strings not visible/unable to remove despite several attempts.  Menses are typically monthly 3-6 days.  [place] completed because strings had not been visualized.  Denies irregular bleeding.  Denies pelvic or abdominal pain.  At this point, she is in a relationship, but not sexually active.  US-06/25/21: 9.4cm anteverted uterus- IUD in LUS extending to cervix with left limb into the left lateral wall.  Normal ovaries bilaterally  Patient's last menstrual period was 08/04/2021 (approximate).  Depression screen [facility] 2/9 08/21/2021 08/03/2018 05/21/2018 11/14/2017 06/18/2017  Decreased Interest 1 0 0 0 1  Down, Depressed, Hopeless 1 0 0 0 1  PHQ - 2 Score 2 0 0 0 2  Altered sleeping 3 - - - 1  Tired, decreased energy 3 - - - 1  Change in appetite 1 - - - 0  Feeling bad or failure about yourself  1 - - - 1  Trouble concentrating 2 - - - 1  Moving slowly or fidgety/restless 0 - - - 0  Suicidal thoughts 1 - - - 0  PHQ-9 Score 13 - - - 6  Difficult doing work/chores - - - - -     Review of Systems:   Pertinent items are noted in HPI Denies fever/chills, dizziness, headaches, visual disturbances, fatigue, shortness of breath, chest pain, abdominal pain, vomiting, no problems with periods, bowel movements, urination, or intercourse unless otherwise stated above.  Pertinent History Reviewed:  Reviewed past medical,surgical, social, obstetrical and family history.  Reviewed problem list, medications and allergies. Physical Assessment:  There were no vitals filed for this visit.There is no height or weight on file to calculate  BMI.       Physical Examination:   General appearance: alert, well appearing, and in no distress  Psych: mood appropriate, normal affect  Skin: warm & dry   Cardiovascular: normal heart rate noted  Respiratory: normal respiratory effort, no distress  Abdomen: soft, non-tender   Pelvic: deferred  Extremities: no edema   Chaperone: N/A    Assessment & Plan:  1) Preop for IUD migration -plan for hysteroscopy, IUD removal -Risk/benefit reviewed including but not limited risk of bleeding, incomplete or inability to remove device -reviewed recovery -scheduled for Nov 8 -f/u prn or 21yr for annual  No orders of the defined types were placed in this encounter.   No follow-ups on file.   Myna Hidalgo, DO Attending Obstetrician & Gynecologist, [facility] for [employer], [facility]

## 2021-08-27 ENCOUNTER — Ambulatory Visit (INDEPENDENT_AMBULATORY_CARE_PROVIDER_SITE_OTHER): Payer: BC Managed Care – PPO | Admitting: Obstetrics & Gynecology

## 2021-08-27 ENCOUNTER — Encounter: Payer: Self-pay | Admitting: Obstetrics & Gynecology

## 2021-08-27 ENCOUNTER — Other Ambulatory Visit: Payer: Self-pay

## 2021-08-27 VITALS — BP 143/90 | HR 113 | Ht 66.0 in | Wt 201.2 lb

## 2021-08-27 DIAGNOSIS — T8332XD Displacement of intrauterine contraceptive device, subsequent encounter: Secondary | ICD-10-CM

## 2021-08-27 DIAGNOSIS — Z01818 Encounter for other preprocedural examination: Secondary | ICD-10-CM

## 2021-08-29 ENCOUNTER — Ambulatory Visit: Payer: BC Managed Care – PPO | Admitting: Family Medicine

## 2021-08-29 ENCOUNTER — Encounter: Payer: Self-pay | Admitting: Family Medicine

## 2021-08-29 ENCOUNTER — Other Ambulatory Visit: Payer: Self-pay

## 2021-08-29 VITALS — BP 165/97 | HR 105 | Temp 98.1°F | Ht 66.0 in | Wt 202.5 lb

## 2021-08-29 DIAGNOSIS — E1140 Type 2 diabetes mellitus with diabetic neuropathy, unspecified: Secondary | ICD-10-CM | POA: Diagnosis not present

## 2021-08-29 DIAGNOSIS — F411 Generalized anxiety disorder: Secondary | ICD-10-CM

## 2021-08-29 DIAGNOSIS — I10 Essential (primary) hypertension: Secondary | ICD-10-CM | POA: Diagnosis not present

## 2021-08-29 DIAGNOSIS — E1159 Type 2 diabetes mellitus with other circulatory complications: Secondary | ICD-10-CM | POA: Diagnosis not present

## 2021-08-29 DIAGNOSIS — E785 Hyperlipidemia, unspecified: Secondary | ICD-10-CM

## 2021-08-29 DIAGNOSIS — E1169 Type 2 diabetes mellitus with other specified complication: Secondary | ICD-10-CM | POA: Diagnosis not present

## 2021-08-29 DIAGNOSIS — I152 Hypertension secondary to endocrine disorders: Secondary | ICD-10-CM

## 2021-08-29 DIAGNOSIS — E039 Hypothyroidism, unspecified: Secondary | ICD-10-CM

## 2021-08-29 DIAGNOSIS — Z6832 Body mass index (BMI) 32.0-32.9, adult: Secondary | ICD-10-CM

## 2021-08-29 DIAGNOSIS — E6609 Other obesity due to excess calories: Secondary | ICD-10-CM

## 2021-08-29 DIAGNOSIS — E038 Other specified hypothyroidism: Secondary | ICD-10-CM | POA: Diagnosis not present

## 2021-08-29 DIAGNOSIS — D509 Iron deficiency anemia, unspecified: Secondary | ICD-10-CM

## 2021-08-29 DIAGNOSIS — Z7689 Persons encountering health services in other specified circumstances: Secondary | ICD-10-CM

## 2021-08-29 DIAGNOSIS — F331 Major depressive disorder, recurrent, moderate: Secondary | ICD-10-CM

## 2021-08-29 LAB — BAYER DCA HB A1C WAIVED: HB A1C (BAYER DCA - WAIVED): 10.7 % — ABNORMAL HIGH (ref 4.8–5.6)

## 2021-08-29 MED ORDER — LEVOTHYROXINE SODIUM 137 MCG PO TABS: 137.0000 ug | ORAL_TABLET | Freq: Every day | ORAL | 3 refills | Status: DC

## 2021-08-29 MED ORDER — LISINOPRIL 20 MG PO TABS: 20.0000 mg | ORAL_TABLET | Freq: Every day | ORAL | 3 refills | Status: DC

## 2021-08-29 MED ORDER — DAPAGLIFLOZIN PROPANEDIOL 10 MG PO TABS: 10.0000 mg | ORAL_TABLET | Freq: Every day | ORAL | 3 refills | Status: DC

## 2021-08-29 NOTE — Progress Notes (Signed)
New Patient Office Visit  Subjective:  Patient ID: Krista Tate, female    DOB: 1981/09/27  Age: 40 y.o. MRN: [id number]  CC:  Chief Complaint  Patient presents with   New Patient (Initial Visit)    HPI [name] presents to establish care. She was previously a patient of the health department.   She has a history of T2DM. She reports it has never been well controlled. She takes metformin and glimepiride. She has tried trulicity but did not do well on this due to the side effects. She also tried [place] but it was too expensive. She does not check her blood sugars at home. She does have a glucometer to do so. She has never had a diabetic eye exam. She take lisinopril and lipitor.  Her blood pressure has been elevated lately. She started on lisinopril 10 mg about 1 week ago. She denies chest pain, shortness of breath, edema, palpitations, dizziness, focal weakness, or visual disturbances.   She has been out of her levothyroxine for a few days.   She reports doing well on her Celexa. She is not interested in adjusting the dosage right now and feels like her depression and anxiety is controlled.   Depression screen [facility] 2/9 08/29/2021 08/21/2021 08/03/2018  Decreased Interest 1 1 0  Down, Depressed, Hopeless 1 1 0  PHQ - 2 Score 2 2 0  Altered sleeping 2 3 -  Tired, decreased energy 1 3 -  Change in appetite 1 1 -  Feeling bad or failure about yourself  1 1 -  Trouble concentrating 1 2 -  Moving slowly or fidgety/restless 1 0 -  Suicidal thoughts 0 1 -  PHQ-9 Score 9 13 -  Difficult doing work/chores Somewhat difficult - -  Some recent data might be hidden   GAD 7 : Generalized Anxiety Score 08/29/2021 08/21/2021 06/05/2016 11/02/2015  Nervous, Anxious, on Edge 1 2 2 1   Control/stop worrying 3 2 3 3   Worry too much - different things 1 2 3 3   Trouble relaxing 2 2 3 3   Restless 2 3 3 2   Easily annoyed or irritable 1 2 3 2   Afraid - awful might happen 1 1 2 1   Total GAD 7  Score 11 14 19 15   Anxiety Difficulty Somewhat difficult - Somewhat difficult Somewhat difficult       Past Medical History:  Diagnosis Date   Depression    Diabetes mellitus without complication ([place])    Hyperlipidemia    Hypertension    Thyroid disease     Past Surgical History:  Procedure Laterality Date   TUMOR EXCISION     behind right ear    Family History  Problem Relation Age of Onset   COPD Mother    Bipolar disorder Brother    Healthy Brother    Bipolar disorder Sister    Bipolar disorder Sister    Bipolar disorder Sister    Asthma Sister     Social History   Socioeconomic History   Marital status: Single    Spouse name: Not on file   Number of children: 0   Years of education: 12   Highest education level: High school graduate  Occupational History   Not on file  Tobacco Use   Smoking status: Never   Smokeless tobacco: Never  Vaping Use   Vaping Use: Never used  Substance and Sexual Activity   Alcohol use: No   Drug use: No   Sexual activity:  Not Currently    Birth control/protection: I.U.D., Abstinence  Other Topics Concern   Not on file  Social History Narrative   Not on file   Social Determinants of Health   Financial Resource Strain: Low Risk    Difficulty of Paying Living Expenses: Not very hard  Food Insecurity: No Food Insecurity   Worried About Running Out of Food in the Last Year: Never true   Ran Out of Food in the Last Year: Never true  Transportation Needs: No Transportation Needs   Lack of Transportation (Medical): No   Lack of Transportation (Non-Medical): No  Physical Activity: Insufficiently Active   Days of Exercise per Week: 2 days   Minutes of Exercise per Session: 30 min  Stress: Stress Concern Present   Feeling of Stress : Very much  Social Connections: Socially Isolated   Frequency of Communication with Friends and Family: Once a week   Frequency of Social Gatherings with Friends and Family: Once a week    Attends Religious Services: Never   Marine scientist or Organizations: No   Attends Music therapist: Never   Marital Status: Living with partner  Intimate Partner Violence: Not At Risk   Fear of Current or Ex-Partner: No   Emotionally Abused: No   Physically Abused: No   Sexually Abused: No    ROS Review of Systems Negative unless specially indicated above in HPI.  Objective:   Today's Vitals: BP (!) 165/97   Pulse (!) 105   Temp 98.1 F (36.7 C) (Temporal)   Ht $R'5\' 6"'QP$  (1.676 m)   Wt 202 lb 8 oz (91.9 kg)   LMP 08/27/2021 (Exact Date)   BMI 32.68 kg/m   Physical Exam Vitals and nursing note reviewed.  Constitutional:      General: She is not in acute distress.    Appearance: She is not ill-appearing, toxic-appearing or diaphoretic.  Cardiovascular:     Rate and Rhythm: Normal rate and regular rhythm.     Heart sounds: Normal heart sounds. No murmur heard. Pulmonary:     Effort: Pulmonary effort is normal.     Breath sounds: Normal breath sounds.  Abdominal:     General: Bowel sounds are normal. There is no distension.     Palpations: Abdomen is soft.     Tenderness: There is no abdominal tenderness. There is no guarding or rebound.  Musculoskeletal:     Right lower leg: No edema.     Left lower leg: No edema.  Skin:    General: Skin is warm and dry.  Neurological:     General: No focal deficit present.     Mental Status: She is alert and oriented to person, place, and time.  Psychiatric:        Mood and Affect: Mood normal.        Behavior: Behavior normal.    Assessment & Plan:   Ceazia was seen today for new patient (initial visit).  Diagnoses and all orders for this visit:  Type 2 diabetes mellitus with diabetic neuropathy, without long-term current use of insulin (HCC) Uncontrolled. A1c 10.7 today. Continue metformin and glimepiride. Start farxiga. Monitor blood sugars at home. Continue statin and ACE. Encouraged to schedule eye  exam. Will do foot exam at next visit. Labs pending as below.  -     CBC with Differential/Platelet -     CMP14+EGFR -     Lipid panel -     Bayer DCA Hb A1c  Waived -     dapagliflozin propanediol (FARXIGA) 10 MG TABS tablet; Take 1 tablet (10 mg total) by mouth daily.  Hypertension associated with diabetes ([place]) Uncontrolled. Just started lisinopril 1 week ago. Increase to 20 mg. Labs pending.  -     CBC with Differential/Platelet -     CMP14+EGFR -     lisinopril (ZESTRIL) 20 MG tablet; Take 1 tablet (20 mg total) by mouth daily.  Dyslipidemia associated with type 2 diabetes mellitus ([place]) On statin. Labs pending.  -     Lipid panel  Class 1 obesity due to excess calories with serious comorbidity and body mass index (BMI) of 32.0 to 32.9 in adult Diet and exercise. Labs pending.  -     CBC with Differential/Platelet -     CMP14+EGFR -     Lipid panel -     Thyroid Panel With TSH  Depression, major, recurrent, moderate (HCC) On celexa. Declined adjustment in medication today. Denies SI.   Generalized anxiety disorder On celexa. Declined adjustment in medication today.   Acquired hypothyroidism Labs pending.  -     Thyroid Panel With TSH -     levothyroxine (SYNTHROID) 137 MCG tablet; Take 1 tablet (137 mcg total) by mouth daily before breakfast.  Iron deficiency anemia, unspecified iron deficiency anemia type On oral iron supplement.   Encounter to establish care   Follow-up: Return in about 4 weeks (around 09/26/2021) for with Almyra Free for DM, 3 months for chronic follow up.  The patient indicates understanding of these issues and agrees with the plan.   Gwenlyn Perking, FNP

## 2021-08-29 NOTE — Patient Instructions (Signed)
Diabetes Mellitus and Nutrition, Adult When you have diabetes, or diabetes mellitus, it is very important to have healthy eating habits because your blood sugar (glucose) levels are greatly affected by what you eat and drink. Eating healthy foods in the right amounts, at about the same times every day, can help you:  Control your blood glucose.  Lower your risk of heart disease.  Improve your blood pressure.  Reach or maintain a healthy weight. What can affect my meal plan? Every person with diabetes is different, and each person has different needs for a meal plan. Your health care provider may recommend that you work with a dietitian to make a meal plan that is best for you. Your meal plan may vary depending on factors such as:  The calories you need.  The medicines you take.  Your weight.  Your blood glucose, blood pressure, and cholesterol levels.  Your activity level.  Other health conditions you have, such as heart or kidney disease. How do carbohydrates affect me? Carbohydrates, also called carbs, affect your blood glucose level more than any other type of food. Eating carbs naturally raises the amount of glucose in your blood. Carb counting is a method for keeping track of how many carbs you eat. Counting carbs is important to keep your blood glucose at a healthy level, especially if you use insulin or take certain oral diabetes medicines. It is important to know how many carbs you can safely have in each meal. This is different for every person. Your dietitian can help you calculate how many carbs you should have at each meal and for each snack. How does alcohol affect me? Alcohol can cause a sudden decrease in blood glucose (hypoglycemia), especially if you use insulin or take certain oral diabetes medicines. Hypoglycemia can be a life-threatening condition. Symptoms of hypoglycemia, such as sleepiness, dizziness, and confusion, are similar to symptoms of having too much  alcohol.  Do not drink alcohol if: ? Your health care provider tells you not to drink. ? You are pregnant, may be pregnant, or are planning to become pregnant.  If you drink alcohol: ? Do not drink on an empty stomach. ? Limit how much you use to:  0-1 drink a day for women.  0-2 drinks a day for men. ? Be aware of how much alcohol is in your drink. In the [place] one drink equals one 12 oz bottle of beer (355 mL), one 5 oz glass of wine (148 mL), or one 1 oz glass of hard liquor (44 mL). ? Keep yourself hydrated with water, diet soda, or unsweetened iced tea.  Keep in mind that regular soda, juice, and other mixers may contain a lot of sugar and must be counted as carbs. What are tips for following this plan? Reading food labels  Start by checking the serving size on the "Nutrition Facts" label of packaged foods and drinks. The amount of calories, carbs, fats, and other nutrients listed on the label is based on one serving of the item. Many items contain more than one serving per package.  Check the total grams (g) of carbs in one serving. You can calculate the number of servings of carbs in one serving by dividing the total carbs by 15. For example, if a food has 30 g of total carbs per serving, it would be equal to 2 servings of carbs.  Check the number of grams (g) of saturated fats and trans fats in one serving. Choose foods that have   a low amount or none of these fats.  Check the number of milligrams (mg) of salt (sodium) in one serving. Most people should limit total sodium intake to less than 2,300 mg per day.  Always check the nutrition information of foods labeled as "low-fat" or "nonfat." These foods may be higher in added sugar or refined carbs and should be avoided.  Talk to your dietitian to identify your daily goals for nutrients listed on the label. Shopping  Avoid buying canned, pre-made, or processed foods. These foods tend to be high in fat, sodium, and added  sugar.  Shop around the outside edge of the grocery store. This is where you will most often find fresh fruits and vegetables, bulk grains, fresh meats, and fresh dairy. Cooking  Use low-heat cooking methods, such as baking, instead of high-heat cooking methods like deep frying.  Cook using healthy oils, such as olive, canola, or sunflower oil.  Avoid cooking with butter, cream, or high-fat meats. Meal planning  Eat meals and snacks regularly, preferably at the same times every day. Avoid going long periods of time without eating.  Eat foods that are high in fiber, such as fresh fruits, vegetables, beans, and whole grains. Talk with your dietitian about how many servings of carbs you can eat at each meal.  Eat 4-6 oz (112-168 g) of lean protein each day, such as lean meat, chicken, fish, eggs, or tofu. One ounce (oz) of lean protein is equal to: ? 1 oz (28 g) of meat, chicken, or fish. ? 1 egg. ?  cup (62 g) of tofu.  Eat some foods each day that contain healthy fats, such as avocado, nuts, seeds, and fish.   What foods should I eat? Fruits Berries. Apples. Oranges. Peaches. Apricots. Plums. Grapes. Mango. Papaya. Pomegranate. Kiwi. Cherries. Vegetables Lettuce. Spinach. Leafy greens, including kale, chard, collard greens, and mustard greens. Beets. Cauliflower. Cabbage. Broccoli. Carrots. Green beans. Tomatoes. Peppers. Onions. Cucumbers. Brussels sprouts. Grains Whole grains, such as whole-wheat or whole-grain bread, crackers, tortillas, cereal, and pasta. Unsweetened oatmeal. Quinoa. Brown or wild rice. Meats and other proteins Seafood. Poultry without skin. Lean cuts of poultry and beef. Tofu. Nuts. Seeds. Dairy Low-fat or fat-free dairy products such as milk, yogurt, and cheese. The items listed above may not be a complete list of foods and beverages you can eat. Contact a dietitian for more information. What foods should I avoid? Fruits Fruits canned with  syrup. Vegetables Canned vegetables. Frozen vegetables with butter or cream sauce. Grains Refined white flour and flour products such as bread, pasta, snack foods, and cereals. Avoid all processed foods. Meats and other proteins Fatty cuts of meat. Poultry with skin. Breaded or fried meats. Processed meat. Avoid saturated fats. Dairy Full-fat yogurt, cheese, or milk. Beverages Sweetened drinks, such as soda or iced tea. The items listed above may not be a complete list of foods and beverages you should avoid. Contact a dietitian for more information. Questions to ask a health care provider  Do I need to meet with a diabetes educator?  Do I need to meet with a dietitian?  What number can I call if I have questions?  When are the best times to check my blood glucose? Where to find more information:  American Diabetes Association: diabetes.org  Academy of Nutrition and Dietetics: www.eatright.org  National Institute of Diabetes and Digestive and Kidney Diseases: www.niddk.nih.gov  Association of Diabetes Care and Education Specialists: www.diabeteseducator.org Summary  It is important to have healthy eating   habits because your blood sugar (glucose) levels are greatly affected by what you eat and drink.  A healthy meal plan will help you control your blood glucose and maintain a healthy lifestyle.  Your health care provider may recommend that you work with a dietitian to make a meal plan that is best for you.  Keep in mind that carbohydrates (carbs) and alcohol have immediate effects on your blood glucose levels. It is important to count carbs and to use alcohol carefully. This information is not intended to replace advice given to you by your health care provider. Make sure you discuss any questions you have with your health care provider. Document Revised: 09/28/2019 Document Reviewed: 09/28/2019 [employer] Patient Education  2021 [employer]  

## 2021-08-30 ENCOUNTER — Other Ambulatory Visit: Payer: Self-pay | Admitting: Family Medicine

## 2021-08-30 DIAGNOSIS — E039 Hypothyroidism, unspecified: Secondary | ICD-10-CM

## 2021-08-30 DIAGNOSIS — E1169 Type 2 diabetes mellitus with other specified complication: Secondary | ICD-10-CM

## 2021-08-30 LAB — CMP14+EGFR
ALT: 19 IU/L (ref 0–32)
AST: 16 IU/L (ref 0–40)
Albumin/Globulin Ratio: 1.4 (ref 1.2–2.2)
Albumin: 3.9 g/dL (ref 3.8–4.8)
Alkaline Phosphatase: 44 IU/L (ref 44–121)
BUN/Creatinine Ratio: 16 (ref 9–23)
BUN: 11 mg/dL (ref 6–24)
Bilirubin Total: <0.2 mg/dL (ref 0.0–1.2)
CO2: 22 mmol/L (ref 20–29)
Calcium: 9.5 mg/dL (ref 8.7–10.2)
Chloride: 98 mmol/L (ref 96–106)
Creatinine, Ser: 0.7 mg/dL (ref 0.57–1.00)
Globulin, Total: 2.7 g/dL (ref 1.5–4.5)
Glucose: 260 mg/dL — ABNORMAL HIGH (ref 70–99)
Potassium: 4.7 mmol/L (ref 3.5–5.2)
Sodium: 137 mmol/L (ref 134–144)
Total Protein: 6.6 g/dL (ref 6.0–8.5)
eGFR: 111 mL/min/{1.73_m2} (ref >59)

## 2021-08-30 LAB — CBC WITH DIFFERENTIAL/PLATELET
Basophils Absolute: 0.1 10*3/uL (ref 0.0–0.2)
Basos: 1 %
EOS (ABSOLUTE): 0.4 10*3/uL (ref 0.0–0.4)
Eos: 5 %
Hematocrit: 41.6 % (ref 34.0–46.6)
Hemoglobin: 13.5 g/dL (ref 11.1–15.9)
Immature Grans (Abs): 0 10*3/uL (ref 0.0–0.1)
Immature Granulocytes: 0 %
Lymphocytes Absolute: 2.3 10*3/uL (ref 0.7–3.1)
Lymphs: 32 %
MCH: 26.7 pg (ref 26.6–33.0)
MCHC: 32.5 g/dL (ref 31.5–35.7)
MCV: 82 fL (ref 79–97)
Monocytes Absolute: 0.6 10*3/uL (ref 0.1–0.9)
Monocytes: 8 %
Neutrophils Absolute: 3.9 10*3/uL (ref 1.4–7.0)
Neutrophils: 54 %
Platelets: 326 10*3/uL (ref 150–450)
RBC: 5.05 x10E6/uL (ref 3.77–5.28)
RDW: 13.1 % (ref 11.7–15.4)
WBC: 7.2 10*3/uL (ref 3.4–10.8)

## 2021-08-30 LAB — THYROID PANEL WITH TSH
Free Thyroxine Index: 3 (ref 1.2–4.9)
T3 Uptake Ratio: 28 % (ref 24–39)
T4, Total: 10.6 ug/dL (ref 4.5–12.0)
TSH: 8.81 u[IU]/mL — ABNORMAL HIGH (ref 0.450–4.500)

## 2021-08-30 LAB — LIPID PANEL
Chol/HDL Ratio: 7.3 ratio — ABNORMAL HIGH (ref 0.0–4.4)
Cholesterol, Total: 247 mg/dL — ABNORMAL HIGH (ref 100–199)
HDL: 34 mg/dL — ABNORMAL LOW (ref >39)
LDL Chol Calc (NIH): 164 mg/dL — ABNORMAL HIGH (ref 0–99)
Triglycerides: 262 mg/dL — ABNORMAL HIGH (ref 0–149)
VLDL Cholesterol Cal: 49 mg/dL — ABNORMAL HIGH (ref 5–40)

## 2021-08-30 MED ORDER — LEVOTHYROXINE SODIUM 150 MCG PO TABS: 150.0000 ug | ORAL_TABLET | Freq: Every day | ORAL | 3 refills | Status: DC

## 2021-08-30 MED ORDER — ATORVASTATIN CALCIUM 80 MG PO TABS: 80.0000 mg | ORAL_TABLET | Freq: Every day | ORAL | 3 refills | Status: DC

## 2021-09-05 ENCOUNTER — Other Ambulatory Visit: Payer: Self-pay | Admitting: Family Medicine

## 2021-09-05 DIAGNOSIS — E1140 Type 2 diabetes mellitus with diabetic neuropathy, unspecified: Secondary | ICD-10-CM

## 2021-09-05 MED ORDER — EMPAGLIFLOZIN 10 MG PO TABS: 10.0000 mg | ORAL_TABLET | Freq: Every day | ORAL | 0 refills | Status: DC

## 2021-09-05 NOTE — Patient Instructions (Signed)
Your procedure is scheduled on: 09/11/2021  Report to [facility] Short Stay at   8:30  AM.  Call this number if you have problems the morning of surgery: [phone]   Remember:   Do not Eat or Drink after midnight         No Smoking the morning of surgery  :  Take these medicines the morning of surgery with A SIP OF WATER: Celexa, Flexeril, and levothyroxine  No diabetic medications am of procedure   Do not wear jewelry, make-up or nail polish.  Do not wear lotions, powders, or perfumes. You may wear deodorant.  Do not shave 48 hours prior to surgery. Men may shave face and neck.  Do not bring valuables to the hospital.  Contacts, dentures or bridgework may not be worn into surgery.  Leave suitcase in the car. After surgery it may be brought to your room.  For patients admitted to the hospital, checkout time is 11:00 AM the day of discharge.   Patients discharged the day of surgery will not be allowed to drive home.    Special Instructions: Shower using CHG night before surgery and shower the day of surgery use CHG.  Use special wash - you have one bottle of CHG for all showers.  You should use approximately 1/2 of the bottle for each shower.  How to Use Chlorhexidine for Bathing Chlorhexidine gluconate (CHG) is a germ-killing (antiseptic) solution that is used to clean the skin. It can get rid of the bacteria that normally live on the skin and can keep them away for about 24 hours. To clean your skin with CHG, you may be given: A CHG solution to use in the shower or as part of a sponge bath. A prepackaged cloth that contains CHG. Cleaning your skin with CHG may help lower the risk for infection: While you are staying in the intensive care unit of the hospital. If you have a vascular access, such as a central line, to provide short-term or long-term access to your veins. If you have a catheter to drain urine from your bladder. If you are on a ventilator. A ventilator is a  machine that helps you breathe by moving air in and out of your lungs. After surgery. What are the risks? Risks of using CHG include: A skin reaction. Hearing loss, if CHG gets in your ears and you have a perforated eardrum. Eye injury, if CHG gets in your eyes and is not rinsed out. The CHG product catching fire. Make sure that you avoid smoking and flames after applying CHG to your skin. Do not use CHG: If you have a chlorhexidine allergy or have previously reacted to chlorhexidine. On babies younger than 16 months of age. How to use CHG solution Use CHG only as told by your health care provider, and follow the instructions on the label. Use the full amount of CHG as directed. Usually, this is one bottle. During a shower Follow these steps when using CHG solution during a shower (unless your health care provider gives you different instructions): Start the shower. Use your normal soap and shampoo to wash your face and hair. Turn off the shower or move out of the shower stream. Pour the CHG onto a clean washcloth. Do not use any type of brush or rough-edged sponge. Starting at your neck, lather your body down to your toes. Make sure you follow these instructions: If you will be having surgery, pay special attention to the part  of your body where you will be having surgery. Scrub this area for at least 1 minute. Do not use CHG on your head or face. If the solution gets into your ears or eyes, rinse them well with water. Avoid your genital area. Avoid any areas of skin that have broken skin, cuts, or scrapes. Scrub your back and under your arms. Make sure to wash skin folds. Let the lather sit on your skin for 1-2 minutes or as long as told by your health care provider. Thoroughly rinse your entire body in the shower. Make sure that all body creases and crevices are rinsed well. Dry off with a clean towel. Do not put any substances on your body afterward--such as powder, lotion, or  perfume--unless you are told to do so by your health care provider. Only use lotions that are recommended by the manufacturer. Put on clean clothes or pajamas. If it is the night before your surgery, sleep in clean sheets.  During a sponge bath Follow these steps when using CHG solution during a sponge bath (unless your health care provider gives you different instructions): Use your normal soap and shampoo to wash your face and hair. Pour the CHG onto a clean washcloth. Starting at your neck, lather your body down to your toes. Make sure you follow these instructions: If you will be having surgery, pay special attention to the part of your body where you will be having surgery. Scrub this area for at least 1 minute. Do not use CHG on your head or face. If the solution gets into your ears or eyes, rinse them well with water. Avoid your genital area. Avoid any areas of skin that have broken skin, cuts, or scrapes. Scrub your back and under your arms. Make sure to wash skin folds. Let the lather sit on your skin for 1-2 minutes or as long as told by your health care provider. Using a different clean, wet washcloth, thoroughly rinse your entire body. Make sure that all body creases and crevices are rinsed well. Dry off with a clean towel. Do not put any substances on your body afterward--such as powder, lotion, or perfume--unless you are told to do so by your health care provider. Only use lotions that are recommended by the manufacturer. Put on clean clothes or pajamas. If it is the night before your surgery, sleep in clean sheets. How to use CHG prepackaged cloths Only use CHG cloths as told by your health care provider, and follow the instructions on the label. Use the CHG cloth on clean, dry skin. Do not use the CHG cloth on your head or face unless your health care provider tells you to. When washing with the CHG cloth: Avoid your genital area. Avoid any areas of skin that have broken  skin, cuts, or scrapes. Before surgery Follow these steps when using a CHG cloth to clean before surgery (unless your health care provider gives you different instructions): Using the CHG cloth, vigorously scrub the part of your body where you will be having surgery. Scrub using a back-and-forth motion for 3 minutes. The area on your body should be completely wet with CHG when you are done scrubbing. Do not rinse. Discard the cloth and let the area air-dry. Do not put any substances on the area afterward, such as powder, lotion, or perfume. Put on clean clothes or pajamas. If it is the night before your surgery, sleep in clean sheets.  For general bathing Follow these steps when using  CHG cloths for general bathing (unless your health care provider gives you different instructions). Use a separate CHG cloth for each area of your body. Make sure you wash between any folds of skin and between your fingers and toes. Wash your body in the following order, switching to a new cloth after each step: The front of your neck, shoulders, and chest. Both of your arms, under your arms, and your hands. Your stomach and groin area, avoiding the genitals. Your right leg and foot. Your left leg and foot. The back of your neck, your back, and your buttocks. Do not rinse. Discard the cloth and let the area air-dry. Do not put any substances on your body afterward--such as powder, lotion, or perfume--unless you are told to do so by your health care provider. Only use lotions that are recommended by the manufacturer. Put on clean clothes or pajamas. Contact a health care provider if: Your skin gets irritated after scrubbing. You have questions about using your solution or cloth. You swallow any chlorhexidine. Call your local poison control center ([phone] in the [place] Get help right away if: Your eyes itch badly, or they become very red or swollen. Your skin itches badly and is red or swollen. Your  hearing changes. You have trouble seeing. You have swelling or tingling in your mouth or throat. You have trouble breathing. These symptoms may represent a serious problem that is an emergency. Do not wait to see if the symptoms will go away. Get medical help right away. Call your local emergency services (911 in the [place] Do not drive yourself to the hospital. Summary Chlorhexidine gluconate (CHG) is a germ-killing (antiseptic) solution that is used to clean the skin. Cleaning your skin with CHG may help to lower your risk for infection. You may be given CHG to use for bathing. It may be in a bottle or in a prepackaged cloth to use on your skin. Carefully follow your health care provider's instructions and the instructions on the product label. Do not use CHG if you have a chlorhexidine allergy. Contact your health care provider if your skin gets irritated after scrubbing. This information is not intended to replace advice given to you by your health care provider. Make sure you discuss any questions you have with your health care provider. Document Revised: 01/01/2021 Document Reviewed: 01/01/2021 Elsevier Patient Education  2022 [employer] Hysteroscopy Hysteroscopy is a procedure used to look inside a woman's womb (uterus). This may be done for various reasons, including: To look for tumors and other growths in the uterus. To evaluate abnormal bleeding, fibroid tumors, polyps, scar tissue, or uterine cancer. To determine why a woman is unable to get pregnant or has had repeated pregnancy losses. To locate an IUD (intrauterine device). To place a birth control device into the fallopian tubes. During this procedure, a thin, flexible tube with a small light and camera (hysteroscope) is used to examine the uterus. The camera sends images to a monitor in the room so that your health care provider can view the inside of your uterus. A hysteroscopy should be done right after a menstrual  period. Tell a health care provider about: Any allergies you have. All medicines you are taking, including vitamins, herbs, eye drops, creams, and over-the-counter medicines. Any problems you or family members have had with anesthetic medicines. Any blood disorders you have. Any surgeries you have had. Any medical conditions you have. Whether you are pregnant or may be pregnant. Whether you have been  diagnosed with an STI (sexually transmitted infection) or you think you have an STI. What are the risks? Generally, this is a safe procedure. However, problems may occur, including: Excessive bleeding. Infection. Damage to the uterus or other structures or organs. Allergic reaction to medicines or fluids that are used in the procedure. What happens before the procedure? Staying hydrated Follow instructions from your health care provider about hydration, which may include: Up to 2 hours before the procedure - you may continue to drink clear liquids, such as water, clear fruit juice, black coffee, and plain tea. Eating and drinking restrictions Follow instructions from your health care provider about eating and drinking, which may include: 8 hours before the procedure - stop eating solid foods and drink clear liquids only. 2 hours before the procedure - stop drinking clear liquids. Medicines Ask your health care provider about: Changing or stopping your regular medicines. This is especially important if you are taking diabetes medicines or blood thinners. Taking medicines such as aspirin and ibuprofen. These medicines can thin your blood. Do not take these medicines unless your health care provider tells you to take them. Taking over-the-counter medicines, vitamins, herbs, and supplements. Medicine may be placed in your cervix the day before the procedure. This medicine causes the cervix to open (dilate). The larger opening makes it easier for the hysteroscope to be inserted into the uterus  during the procedure. General instructions Ask your health care provider: What steps will be taken to help prevent infection. These steps may include: Washing skin with a germ-killing soap. Taking antibiotic medicine. Do not use any products that contain nicotine or tobacco for at least 4 weeks before the procedure. These products include cigarettes, chewing tobacco, and vaping devices, such as e-cigarettes. If you need help quitting, ask your health care provider. Plan to have a responsible adult take you home from the hospital or clinic. Plan to have a responsible adult care for you for the time you are told after you leave the hospital or clinic. This is important. Empty your bladder before the procedure begins. What happens during the procedure? An IV will be inserted into one of your veins. You may be given: A medicine to help you relax (sedative). A medicine that numbs the area around the cervix (local anesthetic). A medicine to make you fall asleep (general anesthetic). A hysteroscope will be inserted through your vagina and into your uterus. Air or fluid will be used to enlarge your uterus to allow your health care provider to see it better. The amount of fluid used will be carefully checked throughout the procedure. In some cases, tissue may be gently scraped from inside the uterus and sent to a lab for testing (biopsy). The procedure may vary among health care providers and hospitals. What can I expect after the procedure? Your blood pressure, heart rate, breathing rate, and blood oxygen level will be monitored until you leave the hospital or clinic. You may have cramps. You may be given medicines for this. You may have bleeding, which may vary from light spotting to menstrual-like bleeding. This is normal. If you had a biopsy, it is up to you to get the results. Ask your health care provider, or the department that is doing the procedure, when your results will be ready. Follow  these instructions at home: Activity Rest as told by your health care provider. Return to your normal activities as told by your health care provider. Ask your health care provider what activities are safe  for you. If you were given a sedative during the procedure, it can affect you for several hours. Do not drive or operate machinery until your health care provider says that it is safe. Medicines Do not take aspirin or other NSAIDs during recovery, as told by your healthcare provider. It can increase the risk of bleeding. Ask your health care provider if the medicine prescribed to you: Requires you to avoid driving or using machinery. Can cause constipation. You may need to take these actions to prevent or treat constipation: Drink enough fluid to keep your urine pale yellow. Take over-the-counter or prescription medicines. Eat foods that are high in fiber, such as beans, whole grains, and fresh fruits and vegetables. Limit foods that are high in fat and processed sugars, such as fried or sweet foods. General instructions Do not douche, use tampons, or have sex for 2 weeks after the procedure, or until your health care provider approves. Do not take baths, swim, or use a hot tub until your health care provider approves. Take showers instead of baths for 2 weeks, or for as long as told by your health care provider. Keep all follow-up visits. This is important. Contact a health care provider if: You feel dizzy or lightheaded. You feel nauseous. You have abnormal vaginal discharge. You have a rash. You have pain that does not get better with medicine. You have chills. Get help right away if: You have bleeding that is heavier than a normal menstrual period. You have a fever. You have pain or cramps that get worse. You develop new abdominal pain. You faint. You have pain in your shoulder. You are short of breath. Summary Hysteroscopy is a procedure that is used to look inside a woman's  womb (uterus). After the procedure, you may have bleeding, which varies from light spotting to menstrual-like bleeding. This is normal. You may also have cramps. Do not douche, use tampons, or have sex for 2 weeks after the procedure, or until your health care provider approves. Plan to have a responsible adult take you home from the hospital or clinic. This information is not intended to replace advice given to you by your health care provider. Make sure you discuss any questions you have with your health care provider. Document Revised: 06/07/2020 Document Reviewed: 06/07/2020 Elsevier Patient Education  2022 [employer] General Anesthesia, Adult, Care After This sheet gives you information about how to care for yourself after your procedure. Your health care provider may also give you more specific instructions. If you have problems or questions, contact your health care provider. What can I expect after the procedure? After the procedure, the following side effects are common: Pain or discomfort at the IV site. Nausea. Vomiting. Sore throat. Trouble concentrating. Feeling cold or chills. Feeling weak or tired. Sleepiness and fatigue. Soreness and body aches. These side effects can affect parts of the body that were not involved in surgery. Follow these instructions at home: For the time period you were told by your health care provider:  Rest. Do not participate in activities where you could fall or become injured. Do not drive or use machinery. Do not drink alcohol. Do not take sleeping pills or medicines that cause drowsiness. Do not make important decisions or sign legal documents. Do not take care of children on your own. Eating and drinking Follow any instructions from your health care provider about eating or drinking restrictions. When you feel hungry, start by eating small amounts of foods that are soft  and easy to digest (bland), such as toast. Gradually return to your  regular diet. Drink enough fluid to keep your urine pale yellow. If you vomit, rehydrate by drinking water, juice, or clear broth. General instructions If you have sleep apnea, surgery and certain medicines can increase your risk for breathing problems. Follow instructions from your health care provider about wearing your sleep device: Anytime you are sleeping, including during daytime naps. While taking prescription pain medicines, sleeping medicines, or medicines that make you drowsy. Have a responsible adult stay with you for the time you are told. It is important to have someone help care for you until you are awake and alert. Return to your normal activities as told by your health care provider. Ask your health care provider what activities are safe for you. Take over-the-counter and prescription medicines only as told by your health care provider. If you smoke, do not smoke without supervision. Keep all follow-up visits as told by your health care provider. This is important. Contact a health care provider if: You have nausea or vomiting that does not get better with medicine. You cannot eat or drink without vomiting. You have pain that does not get better with medicine. You are unable to pass urine. You develop a skin rash. You have a fever. You have redness around your IV site that gets worse. Get help right away if: You have difficulty breathing. You have chest pain. You have blood in your urine or stool, or you vomit blood. Summary After the procedure, it is common to have a sore throat or nausea. It is also common to feel tired. Have a responsible adult stay with you for the time you are told. It is important to have someone help care for you until you are awake and alert. When you feel hungry, start by eating small amounts of foods that are soft and easy to digest (bland), such as toast. Gradually return to your regular diet. Drink enough fluid to keep your urine pale  yellow. Return to your normal activities as told by your health care provider. Ask your health care provider what activities are safe for you. This information is not intended to replace advice given to you by your health care provider. Make sure you discuss any questions you have with your health care provider. Document Revised: 07/06/2020 Document Reviewed: 02/03/2020 Elsevier Patient Education  2022 [employer].

## 2021-09-07 ENCOUNTER — Encounter (HOSPITAL_COMMUNITY)
Admission: RE | Admit: 2021-09-07 | Discharge: 2021-09-07 | Disposition: A | Discharge status: Home / Self Care | Payer: BC Managed Care – PPO | Source: Ambulatory Visit | Attending: Obstetrics & Gynecology | Admitting: Obstetrics & Gynecology

## 2021-09-07 DIAGNOSIS — Z01818 Encounter for other preprocedural examination: Secondary | ICD-10-CM | POA: Insufficient documentation

## 2021-09-07 LAB — PREGNANCY, URINE: Preg Test, Ur: NEGATIVE

## 2021-09-07 LAB — GLUCOSE, CAPILLARY: Glucose-Capillary: 258 mg/dL — ABNORMAL HIGH (ref 70–99)

## 2021-09-07 NOTE — H&P (Signed)
[facility] Obstetrics and Gynecology Attending History and Physical  Krista Tate is a 40 y.o. G1P0010 who presents for hysteroscopy, IUD removal due to IUD migration  In review, in-office removal was attempted without success.  Denies any abnormal vaginal discharge, fevers, chills, sweats, dysuria, nausea, vomiting, other GI or GU symptoms or other general symptoms.  Menses regular each month- denies HMB or dysmenorrhea.  Past Medical History:  Diagnosis Date   Depression    Diabetes mellitus without complication ([place])    Hyperlipidemia    Hypertension    Thyroid disease    Past Surgical History:  Procedure Laterality Date   TUMOR EXCISION     behind right ear   OB History  Gravida Para Term Preterm AB Living  1       1    SAB IAB Ectopic Multiple Live Births  1            # Outcome Date GA Lbr Len/2nd Weight Sex Delivery Anes PTL Lv  1 SAB           Patient denies any other pertinent gynecologic issues.  No current facility-administered medications on file prior to encounter.   Current Outpatient Medications on File Prior to Encounter  Medication Sig Dispense Refill   cholecalciferol (VITAMIN D3) 25 MCG (1000 UNIT) tablet Take 1,000 Units by mouth in the morning and at bedtime.     citalopram (CELEXA) 20 MG tablet Take 1 tablet (20 mg total) by mouth daily. 30 tablet 3   cyclobenzaprine (FLEXERIL) 10 MG tablet Take 10 mg by mouth 3 (three) times daily as needed for muscle spasms.     diphenhydrAMINE (BENADRYL) 25 MG tablet Take 25 mg by mouth every 6 (six) hours as needed for allergies.     levothyroxine (SYNTHROID) 137 MCG tablet Take 137 mcg by mouth daily before breakfast.     metFORMIN (GLUCOPHAGE) 1000 MG tablet TAKE 1 TABLET BY MOUTH TWICE DAILY WITH MEALS 60 tablet 0   Multiple Minerals (CALCIUM-MAGNESIUM-ZINC) TABS Take 1 tablet by mouth daily.     PARAGARD INTRAUTERINE COPPER IU 1 each by Intrauterine route once.     Allergies  Allergen Reactions   Other  Hives and Rash    Metal    Social History:   reports that she has never smoked. She has never used smokeless tobacco. She reports that she does not drink alcohol and does not use drugs. Family History  Problem Relation Age of Onset   COPD Mother    Bipolar disorder Brother    Healthy Brother    Bipolar disorder Sister    Bipolar disorder Sister    Bipolar disorder Sister    Asthma Sister     Review of Systems: Pertinent items noted in HPI and remainder of comprehensive ROS otherwise negative.  PHYSICAL EXAM: completed in office Last menstrual period 08/27/2021. CONSTITUTIONAL: Well-developed, well-nourished female in no acute distress.  SKIN: Skin is warm and dry. No rash noted. Not diaphoretic. No erythema. No pallor. NEUROLOGIC: Alert and oriented to person, place, and time. Normal reflexes, muscle tone coordination. No cranial nerve deficit noted. PSYCHIATRIC: Normal mood and affect. Normal behavior. Normal judgment and thought content. CARDIOVASCULAR: Normal heart rate noted, regular rhythm RESPIRATORY: Effort and breath sounds normal, no problems with respiration noted ABDOMEN: Soft, nontender, nondistended. PELVIC: strings not visualized at cervical os MUSCULOSKELETAL: no calf tenderness bilaterally EXT: no edema bilaterally, normal pulses  Labs: Results for orders placed or performed during the hospital encounter of  09/07/21 (from the past 336 hour(s))  Glucose, capillary   Collection Time: 09/07/21  8:57 AM  Result Value Ref Range   Glucose-Capillary 258 (H) 70 - 99 mg/dL  Results for orders placed or performed in visit on 08/29/21 (from the past 336 hour(s))  Bayer DCA Hb A1c Waived   Collection Time: 08/29/21  9:01 AM  Result Value Ref Range   HB A1C (BAYER DCA - WAIVED) 10.7 (H) 4.8 - 5.6 %  CBC with Differential/Platelet   Collection Time: 08/29/21  9:05 AM  Result Value Ref Range   WBC 7.2 3.4 - 10.8 x10E3/uL   RBC 5.05 3.77 - 5.28 x10E6/uL   Hemoglobin  13.5 11.1 - 15.9 g/dL   Hematocrit 41.6 34.0 - 46.6 %   MCV 82 79 - 97 fL   MCH 26.7 26.6 - 33.0 pg   MCHC 32.5 31.5 - 35.7 g/dL   RDW 13.1 11.7 - 15.4 %   Platelets 326 150 - 450 x10E3/uL   Neutrophils 54 Not Estab. %   Lymphs 32 Not Estab. %   Monocytes 8 Not Estab. %   Eos 5 Not Estab. %   Basos 1 Not Estab. %   Neutrophils Absolute 3.9 1.4 - 7.0 x10E3/uL   Lymphocytes Absolute 2.3 0.7 - 3.1 x10E3/uL   Monocytes Absolute 0.6 0.1 - 0.9 x10E3/uL   EOS (ABSOLUTE) 0.4 0.0 - 0.4 x10E3/uL   Basophils Absolute 0.1 0.0 - 0.2 x10E3/uL   Immature Granulocytes 0 Not Estab. %   Immature Grans (Abs) 0.0 0.0 - 0.1 x10E3/uL  CMP14+EGFR   Collection Time: 08/29/21  9:05 AM  Result Value Ref Range   Glucose 260 (H) 70 - 99 mg/dL   BUN 11 6 - 24 mg/dL   Creatinine, Ser 0.70 0.57 - 1.00 mg/dL   eGFR 111 >59 mL/min/1.73   BUN/Creatinine Ratio 16 9 - 23   Sodium 137 134 - 144 mmol/L   Potassium 4.7 3.5 - 5.2 mmol/L   Chloride 98 96 - 106 mmol/L   CO2 22 20 - 29 mmol/L   Calcium 9.5 8.7 - 10.2 mg/dL   Total Protein 6.6 6.0 - 8.5 g/dL   Albumin 3.9 3.8 - 4.8 g/dL   Globulin, Total 2.7 1.5 - 4.5 g/dL   Albumin/Globulin Ratio 1.4 1.2 - 2.2   Bilirubin Total <0.2 0.0 - 1.2 mg/dL   Alkaline Phosphatase 44 44 - 121 IU/L   AST 16 0 - 40 IU/L   ALT 19 0 - 32 IU/L  Lipid panel   Collection Time: 08/29/21  9:05 AM  Result Value Ref Range   Cholesterol, Total 247 (H) 100 - 199 mg/dL   Triglycerides 262 (H) 0 - 149 mg/dL   HDL 34 (L) >39 mg/dL   VLDL Cholesterol Cal 49 (H) 5 - 40 mg/dL   LDL Chol Calc (NIH) 164 (H) 0 - 99 mg/dL   Chol/HDL Ratio 7.3 (H) 0.0 - 4.4 ratio  Thyroid Panel With TSH   Collection Time: 08/29/21  9:05 AM  Result Value Ref Range   TSH 8.810 (H) 0.450 - 4.500 uIU/mL   T4, Total 10.6 4.5 - 12.0 ug/dL   T3 Uptake Ratio 28 24 - 39 %   Free Thyroxine Index 3.0 1.2 - 4.9    Imaging Studies: US-06/25/21: 9.4cm anteverted uterus- IUD in LUS extending to cervix with left limb  into the left lateral wall.  Normal ovaries bilaterally  Assessment: IUD migration   Plan: Hysteroscopy with IUD removal -  NPO -LR @ 125cc/hr -SCDs to OR -Risk/benefits and alternatives reviewed with the patient including but not limited to risk of bleeding, infection and injury such as uterine perforation or inability to remove the device.  Questions and concerns were addressed and pt desires to proceed  Janyth Pupa, DO Attending [place], [facility] for [facility], [place]

## 2021-09-10 ENCOUNTER — Telehealth: Payer: Self-pay | Admitting: Family Medicine

## 2021-09-10 NOTE — Telephone Encounter (Signed)
  Prescription Request  09/10/2021  Is this a "Controlled Substance" medicine? no  Have you seen your PCP in the last 2 weeks? 08/29/2021 New pt  If YES, route message to pool  -  If NO, patient needs to be scheduled for appointment.  What is the name of the medication or equipment? cyclobenzaprine (FLEXERIL) 10 MG tablet  Have you contacted your pharmacy to request a refill? no   Which pharmacy would you like this sent to? [facility]   Patient notified that their request is being sent to the clinical staff for review and that they should receive a response within 2 business days.

## 2021-09-11 ENCOUNTER — Encounter (HOSPITAL_COMMUNITY)
Admission: RE | Disposition: A | Discharge status: Home / Self Care | Payer: Self-pay | Source: Home / Self Care | Attending: Obstetrics & Gynecology

## 2021-09-11 ENCOUNTER — Ambulatory Visit (HOSPITAL_COMMUNITY): Payer: BC Managed Care – PPO | Admitting: Anesthesiology

## 2021-09-11 ENCOUNTER — Encounter (HOSPITAL_COMMUNITY): Payer: Self-pay | Admitting: Obstetrics & Gynecology

## 2021-09-11 ENCOUNTER — Ambulatory Visit (HOSPITAL_COMMUNITY)
Admission: RE | Admit: 2021-09-11 | Discharge: 2021-09-11 | Disposition: A | Discharge status: Home / Self Care | Payer: BC Managed Care – PPO | Attending: Obstetrics & Gynecology | Admitting: Obstetrics & Gynecology

## 2021-09-11 DIAGNOSIS — T8332XA Displacement of intrauterine contraceptive device, initial encounter: Secondary | ICD-10-CM | POA: Diagnosis not present

## 2021-09-11 DIAGNOSIS — Y939 Activity, unspecified: Secondary | ICD-10-CM | POA: Insufficient documentation

## 2021-09-11 DIAGNOSIS — E1140 Type 2 diabetes mellitus with diabetic neuropathy, unspecified: Secondary | ICD-10-CM | POA: Diagnosis not present

## 2021-09-11 DIAGNOSIS — T8332XD Displacement of intrauterine contraceptive device, subsequent encounter: Secondary | ICD-10-CM

## 2021-09-11 DIAGNOSIS — Y762 Prosthetic and other implants, materials and accessory obstetric and gynecological devices associated with adverse incidents: Secondary | ICD-10-CM | POA: Insufficient documentation

## 2021-09-11 DIAGNOSIS — Z7984 Long term (current) use of oral hypoglycemic drugs: Secondary | ICD-10-CM | POA: Insufficient documentation

## 2021-09-11 DIAGNOSIS — E119 Type 2 diabetes mellitus without complications: Secondary | ICD-10-CM | POA: Diagnosis not present

## 2021-09-11 DIAGNOSIS — E039 Hypothyroidism, unspecified: Secondary | ICD-10-CM | POA: Diagnosis not present

## 2021-09-11 DIAGNOSIS — D649 Anemia, unspecified: Secondary | ICD-10-CM | POA: Diagnosis not present

## 2021-09-11 DIAGNOSIS — X58XXXA Exposure to other specified factors, initial encounter: Secondary | ICD-10-CM | POA: Diagnosis not present

## 2021-09-11 DIAGNOSIS — Z01818 Encounter for other preprocedural examination: Secondary | ICD-10-CM

## 2021-09-11 HISTORY — PX: HYSTEROSCOPY: SHX211

## 2021-09-11 HISTORY — PX: IUD REMOVAL: SHX5392

## 2021-09-11 LAB — GLUCOSE, CAPILLARY: Glucose-Capillary: 240 mg/dL — ABNORMAL HIGH (ref 70–99)

## 2021-09-11 SURGERY — HYSTEROSCOPY
Anesthesia: General | Site: Vagina

## 2021-09-11 MED ORDER — DEXAMETHASONE SODIUM PHOSPHATE 10 MG/ML IJ SOLN
INTRAMUSCULAR | Status: DC | PRN
  Administered 2021-09-11: 10 mg via INTRAVENOUS

## 2021-09-11 MED ORDER — CYCLOBENZAPRINE HCL 10 MG PO TABS: 10.0000 mg | ORAL_TABLET | Freq: Three times a day (TID) | ORAL | 1 refills | Status: DC | PRN

## 2021-09-11 MED ORDER — ORAL CARE MOUTH RINSE: 15.0000 mL | Freq: Once | OROMUCOSAL | Status: AC

## 2021-09-11 MED ORDER — MIDAZOLAM HCL 2 MG/2ML IJ SOLN
INTRAMUSCULAR | Status: AC
  Filled 2021-09-11: qty 2

## 2021-09-11 MED ORDER — KETOROLAC TROMETHAMINE 30 MG/ML IJ SOLN
INTRAMUSCULAR | Status: AC
  Filled 2021-09-11: qty 1

## 2021-09-11 MED ORDER — FENTANYL CITRATE (PF) 100 MCG/2ML IJ SOLN
INTRAMUSCULAR | Status: DC | PRN
  Administered 2021-09-11: 100 ug via INTRAVENOUS

## 2021-09-11 MED ORDER — ONDANSETRON HCL 4 MG/2ML IJ SOLN: 4.0000 mg | Freq: Once | INTRAMUSCULAR | Status: DC | PRN

## 2021-09-11 MED ORDER — FENTANYL CITRATE (PF) 100 MCG/2ML IJ SOLN
INTRAMUSCULAR | Status: AC
  Filled 2021-09-11: qty 2

## 2021-09-11 MED ORDER — SODIUM CHLORIDE 0.9 % IR SOLN
Status: DC | PRN
  Administered 2021-09-11: 3000 mL

## 2021-09-11 MED ORDER — CHLORHEXIDINE GLUCONATE 0.12 % MT SOLN
15.0000 mL | Freq: Once | OROMUCOSAL | Status: AC
  Administered 2021-09-11: 15 mL via OROMUCOSAL

## 2021-09-11 MED ORDER — LIDOCAINE-EPINEPHRINE 0.5 %-1:200000 IJ SOLN
INTRAMUSCULAR | Status: DC | PRN
  Administered 2021-09-11: 12 mL

## 2021-09-11 MED ORDER — PROPOFOL 10 MG/ML IV BOLUS
INTRAVENOUS | Status: AC
  Filled 2021-09-11: qty 20

## 2021-09-11 MED ORDER — ONDANSETRON HCL 4 MG/2ML IJ SOLN
INTRAMUSCULAR | Status: AC
  Filled 2021-09-11: qty 2

## 2021-09-11 MED ORDER — MIDAZOLAM HCL 5 MG/5ML IJ SOLN
INTRAMUSCULAR | Status: DC | PRN
  Administered 2021-09-11: 2 mg via INTRAVENOUS

## 2021-09-11 MED ORDER — LACTATED RINGERS IV SOLN: INTRAVENOUS | Status: DC

## 2021-09-11 MED ORDER — SEVOFLURANE IN SOLN
RESPIRATORY_TRACT | Status: AC
  Filled 2021-09-11: qty 250

## 2021-09-11 MED ORDER — KETOROLAC TROMETHAMINE 30 MG/ML IJ SOLN
INTRAMUSCULAR | Status: DC | PRN
  Administered 2021-09-11: 30 mg via INTRAVENOUS

## 2021-09-11 MED ORDER — DEXAMETHASONE SODIUM PHOSPHATE 10 MG/ML IJ SOLN
INTRAMUSCULAR | Status: AC
  Filled 2021-09-11: qty 1

## 2021-09-11 MED ORDER — LIDOCAINE-EPINEPHRINE 0.5 %-1:200000 IJ SOLN
INTRAMUSCULAR | Status: AC
  Filled 2021-09-11: qty 1

## 2021-09-11 MED ORDER — MEPERIDINE HCL 50 MG/ML IJ SOLN: 6.2500 mg | INTRAMUSCULAR | Status: DC | PRN

## 2021-09-11 MED ORDER — PROPOFOL 10 MG/ML IV BOLUS
INTRAVENOUS | Status: DC | PRN
  Administered 2021-09-11: 200 mg via INTRAVENOUS

## 2021-09-11 MED ORDER — ONDANSETRON HCL 4 MG/2ML IJ SOLN
INTRAMUSCULAR | Status: DC | PRN
  Administered 2021-09-11: 4 mg via INTRAVENOUS

## 2021-09-11 MED ORDER — FENTANYL CITRATE PF 50 MCG/ML IJ SOSY: 25.0000 ug | PREFILLED_SYRINGE | INTRAMUSCULAR | Status: DC | PRN

## 2021-09-11 SURGICAL SUPPLY — 18 items
CATH ROBINSON RED A/P 18FR (CATHETERS) ×3 IMPLANT
GAUZE 4X4 16PLY ~~LOC~~+RFID DBL (SPONGE) ×6 IMPLANT
GLOVE SURG LTX SZ6.5 (GLOVE) ×3 IMPLANT
GLOVE SURG UNDER POLY LF SZ7 (GLOVE) ×9 IMPLANT
GOWN STRL REUS W/ TWL LRG LVL3 (GOWN DISPOSABLE) ×1 IMPLANT
GOWN STRL REUS W/TWL LRG LVL3 (GOWN DISPOSABLE) ×5 IMPLANT
IV NS IRRIG 3000ML ARTHROMATIC (IV SOLUTION) ×3 IMPLANT
KIT PROCEDURE FLUENT (KITS) ×3 IMPLANT
KIT TURNOVER CYSTO (KITS) ×3 IMPLANT
NEEDLE HYPO 18GX1.5 BLUNT FILL (NEEDLE) ×3 IMPLANT
NS IRRIG 1000ML POUR BTL (IV SOLUTION) ×3 IMPLANT
PACK VAGINAL MINOR WOMEN LF (CUSTOM PROCEDURE TRAY) ×3 IMPLANT
PAD ARMBOARD 7.5X6 YLW CONV (MISCELLANEOUS) ×3 IMPLANT
SEAL ROD LENS SCOPE MYOSURE (ABLATOR) ×3 IMPLANT
SOL PREP POV-IOD 4OZ 10% (MISCELLANEOUS) ×3 IMPLANT
SYR 30ML LL (SYRINGE) ×3 IMPLANT
TOWEL OR 17X26 4PK STRL BLUE (TOWEL DISPOSABLE) ×3 IMPLANT
UNDERPAD 30X36 HEAVY ABSORB (UNDERPADS AND DIAPERS) ×3 IMPLANT

## 2021-09-11 NOTE — Telephone Encounter (Signed)
Patient aware and verbalized understanding. °

## 2021-09-11 NOTE — Telephone Encounter (Signed)
Prescription sent to pharmacy.

## 2021-09-11 NOTE — Interval H&P Note (Signed)
History and Physical Interval Note:  09/11/2021 9:19 AM  Krista Tate  has presented today for surgery, with the diagnosis of Malpositioned intrauterine device IUD.  The various methods of treatment have been discussed with the patient and family. After consideration of risks, benefits and other options for treatment, the patient has consented to  Procedure(s): HYSTEROSCOPY (N/A) INTRAUTERINE DEVICE (IUD) REMOVAL (N/A) as a surgical intervention.  The patient's history has been reviewed, patient examined, no change in status, stable for surgery.  I have reviewed the patient's chart and labs.  Questions were answered to the patient's satisfaction.     Sharon Seller

## 2021-09-11 NOTE — Anesthesia Preprocedure Evaluation (Signed)
Anesthesia Evaluation  Patient identified by MRN, date of birth, ID band Patient awake    Reviewed: Allergy & Precautions, NPO status , Patient's Chart, lab work & pertinent test results  Airway Mallampati: II  TM Distance: >3 FB Neck ROM: Full    Dental  (+) Dental Advisory Given, Teeth Intact   Pulmonary neg pulmonary ROS,    Pulmonary exam normal breath sounds clear to auscultation       Cardiovascular Exercise Tolerance: Good hypertension, Pt. on medications Normal cardiovascular exam Rhythm:Regular Rate:Normal     Neuro/Psych PSYCHIATRIC DISORDERS Anxiety Depression negative neurological ROS     GI/Hepatic negative GI ROS, Neg liver ROS,   Endo/Other  diabetes, Poorly Controlled, Type 2, Oral Hypoglycemic AgentsHypothyroidism   Renal/GU      Musculoskeletal   Abdominal   Peds  Hematology  (+) anemia ,   Anesthesia Other Findings   Reproductive/Obstetrics                            Anesthesia Physical Anesthesia Plan  ASA: 3  Anesthesia Plan: General   Post-op Pain Management:    Induction: Intravenous  PONV Risk Score and Plan: 4 or greater and Ondansetron  Airway Management Planned: Nasal Cannula, Natural Airway and Simple Face Mask  Additional Equipment:   Intra-op Plan:   Post-operative Plan:   Informed Consent: I have reviewed the patients History and Physical, chart, labs and discussed the procedure including the risks, benefits and alternatives for the proposed anesthesia with the patient or authorized representative who has indicated his/her understanding and acceptance.     Dental advisory given  Plan Discussed with: CRNA and Surgeon  Anesthesia Plan Comments: (Possible GA with airway was discussed.)      Anesthesia Quick Evaluation

## 2021-09-11 NOTE — Transfer of Care (Signed)
Immediate Anesthesia Transfer of Care Note  Patient: Krista Tate  Procedure(s) Performed: HYSTEROSCOPY (Vagina ) INTRAUTERINE DEVICE (IUD) REMOVAL (Vagina )  Patient Location: PACU  Anesthesia Type:General  Level of Consciousness: awake, alert , oriented and patient cooperative  Airway & Oxygen Therapy: Patient Spontanous Breathing and Patient connected to nasal cannula oxygen  Post-op Assessment: Report given to RN, Post -op Vital signs reviewed and stable and Patient moving all extremities X 4  Post vital signs: Reviewed and stable  Last Vitals:  Vitals Value Taken Time  BP 129/86 09/11/21 1051  Temp    Pulse 99 09/11/21 1052  Resp 15 09/11/21 1052  SpO2 92 % 09/11/21 1052  Vitals shown include unvalidated device data.  Last Pain:  Vitals:   09/11/21 0911  TempSrc: (P) Oral      Patients Stated Pain Goal: (P) 7 (09/11/21 0911)  Complications: No notable events documented.

## 2021-09-11 NOTE — Anesthesia Postprocedure Evaluation (Signed)
Anesthesia Post Note  Patient: Krista Tate  Procedure(s) Performed: HYSTEROSCOPY (Vagina ) INTRAUTERINE DEVICE (IUD) REMOVAL (Vagina )  Patient location during evaluation: PACU Anesthesia Type: General Level of consciousness: awake and alert Pain management: pain level controlled Vital Signs Assessment: post-procedure vital signs reviewed and stable Respiratory status: spontaneous breathing, nonlabored ventilation and respiratory function stable Cardiovascular status: blood pressure returned to baseline and stable Postop Assessment: no apparent nausea or vomiting Anesthetic complications: no   No notable events documented.   Last Vitals:  Vitals:   09/11/21 1051 09/11/21 1124  BP: 129/86 125/86  Pulse:  95  Resp:  18  Temp: 37.1 C 36.9 C  SpO2: (!) 89% 95%    Last Pain:  Vitals:   09/11/21 1124  TempSrc: Oral  PainSc: 0-No pain                 Jameire Kouba C Clinton Dragone

## 2021-09-11 NOTE — Op Note (Signed)
Operative Report  PreOp: IUD migration PostOp: same Procedure:  Hysteroscopy, IUD removal Surgeon: Dr. Myna Hidalgo Anesthesia: General, cervical block Complications:none EBL: Minimal UOP: 50cc  Findings 9cm anteverted uterus, both ostia visualized.  Paragard IUD within endometrium  Specimens: none  Procedure: The patient was taken to the operating room where she underwent general anesthesia without difficulty. The patient was placed in a low lithotomy position using Allen stirrups. The patient was examined with the findings as noted above.  She was then prepped and draped in the normal sterile fashion. The bladder was drained using a red rubber urethral catheter. A sterile speculum was inserted into the vagina. Cervical block was completed using 1% lidocaine with epinephrine.   A single tooth tenaculum was placed on the anterior lip of the cervix.  The uterus was then sounded to 9cm. The endocervical canal was then serially dilated using Hank dilators.  The diagnostic hysteroscope was then inserted without difficulty and noted to have the findings as listed above. Graspers were inserted and the device was removed without difficulty.  Visualization was achieved using NS as a distending medium. All instrument were then removed. Hemostasis was observed at the cervical site. The patient was repositioned to the supine position. The patient tolerated the procedure without any complications and taken to recovery in stable condition.   Myna Hidalgo, DO Attending Obstetrician & Gynecologist, [facility] for [employer], [facility]

## 2021-09-11 NOTE — Discharge Instructions (Addendum)
HOME INSTRUCTIONS  Please note any unusual or excessive bleeding, pain, swelling. Mild dizziness or drowsiness are normal for about 24 hours after surgery.   Shower when comfortable  Restrictions: No driving for 24 hours or while taking pain medications.  Activity:  You may return to regular activity    Diet:  You may return to your regular diet.  Do not eat large meals.  Eat small frequent meals throughout the day.  Continue to drink a good amount of water at least 6-8 glasses of water per day, hydration is very important for the healing process.  Pain Management: Take Motrin and/or Tylenol over the counter as needed for pain.  You may use a heating pack  Nausea: Take sips of ginger ale or soda  Fever -- Call physician if temperature over 101 degrees  Follow up:  If you experience fever (a temperature greater than 100.4), pain unrelieved by pain medication, shortness of breath, swelling of a single leg, or any other symptoms which are concerning to you please the office immediately.

## 2021-09-11 NOTE — Anesthesia Procedure Notes (Signed)
Procedure Name: LMA Insertion Date/Time: 09/11/2021 10:20 AM Performed by: Shanon Payor, CRNA Pre-anesthesia Checklist: Patient identified, Emergency Drugs available, Suction available, Patient being monitored and Timeout performed Patient Re-evaluated:Patient Re-evaluated prior to induction Oxygen Delivery Method: Circle system utilized Preoxygenation: Pre-oxygenation with 100% oxygen Induction Type: IV induction LMA: LMA inserted LMA Size: 4.0 Number of attempts: 1 Placement Confirmation: positive ETCO2 and breath sounds checked- equal and bilateral Tube secured with: Tape Dental Injury: Teeth and Oropharynx as per pre-operative assessment

## 2021-09-12 ENCOUNTER — Encounter (HOSPITAL_COMMUNITY): Payer: Self-pay | Admitting: Obstetrics & Gynecology

## 2021-09-14 ENCOUNTER — Ambulatory Visit: Payer: BC Managed Care – PPO | Admitting: Adult Health

## 2021-09-17 ENCOUNTER — Other Ambulatory Visit: Payer: Self-pay | Admitting: Family Medicine

## 2021-09-19 ENCOUNTER — Other Ambulatory Visit: Payer: Self-pay | Admitting: Family Medicine

## 2021-09-19 MED ORDER — METFORMIN HCL 1000 MG PO TABS: ORAL_TABLET | ORAL | 0 refills | Status: DC

## 2021-09-21 ENCOUNTER — Encounter: Payer: BC Managed Care – PPO | Admitting: Obstetrics & Gynecology

## 2021-09-25 ENCOUNTER — Ambulatory Visit: Payer: BC Managed Care – PPO | Admitting: Pharmacist

## 2021-10-01 ENCOUNTER — Encounter: Payer: Self-pay | Admitting: Family Medicine

## 2021-10-01 ENCOUNTER — Telehealth (INDEPENDENT_AMBULATORY_CARE_PROVIDER_SITE_OTHER): Payer: BC Managed Care – PPO | Admitting: Family Medicine

## 2021-10-01 DIAGNOSIS — R051 Acute cough: Secondary | ICD-10-CM | POA: Diagnosis not present

## 2021-10-01 MED ORDER — PREDNISONE 20 MG PO TABS: 40.0000 mg | ORAL_TABLET | Freq: Every day | ORAL | 0 refills | Status: AC

## 2021-10-01 MED ORDER — BENZONATATE 100 MG PO CAPS: 100.0000 mg | ORAL_CAPSULE | Freq: Three times a day (TID) | ORAL | 0 refills | Status: DC | PRN

## 2021-10-01 NOTE — Progress Notes (Signed)
   Virtual Visit via video Note   Due to COVID-19 pandemic this visit was conducted virtually. This visit type was conducted due to national recommendations for restrictions regarding the COVID-19 Pandemic (e.g. social distancing, sheltering in place) in an effort to limit this patient's exposure and mitigate transmission in our community. All issues noted in this document were discussed and addressed.  A physical exam was not performed with this format.  I connected with  Krista Tate  on 10/01/21 at 1140 by video and verified that I am speaking with the correct person using two identifiers. Krista Tate is currently located at home and no one is currently with her during the visit. The provider, Gabriel Earing, FNP is located in their office at time of visit.  I discussed the limitations, risks, security and privacy concerns of performing an evaluation and management service by video  and the availability of in person appointments. I also discussed with the patient that there may be a patient responsible charge related to this service. The patient expressed understanding and agreed to proceed.  CC: cough  History and Present Illness:  Krista Tate reports a dry cough for 2 weeks. She also reports hoarseness. She had nasal congestion previously but this has resolved. She denies fever, shortness of breath, or wheezing. She has tried cough syrups and decongestants with cough suppressants without improvement. She denies edema or orthopnea. She has some discomfort in her chest with coughing.    ROS As per HPI.     Observations/Objective: Alert and oriented x 3. Able to speak in full sentences without difficulty. Hoarseness and coughing noted. Respirations appear unlabored.   Assessment and Plan: Krista Tate was seen today for cough.  Diagnoses and all orders for this visit:  Acute cough Prednisone burst as below. Tessalon perles also ordered. Return to office for new or worsening symptoms, or if  symptoms persist.  -     predniSONE (DELTASONE) 20 MG tablet; Take 2 tablets (40 mg total) by mouth daily with breakfast for 5 days. -     benzonatate (TESSALON PERLES) 100 MG capsule; Take 1 capsule (100 mg total) by mouth 3 (three) times daily as needed for cough.    Follow Up Instructions: As needed.     I discussed the assessment and treatment plan with the patient. The patient was provided an opportunity to ask questions and all were answered. The patient agreed with the plan and demonstrated an understanding of the instructions.   The patient was advised to call back or seek an in-person evaluation if the symptoms worsen or if the condition fails to improve as anticipated.  The above assessment and management plan was discussed with the patient. The patient verbalized understanding of and has agreed to the management plan. Patient is aware to call the clinic if symptoms persist or worsen. Patient is aware when to return to the clinic for a follow-up visit. Patient educated on when it is appropriate to go to the emergency department.   Time call ended: 1146  I provided 6 minutes of face-to-face time during this encounter.    Gabriel Earing, FNP

## 2021-11-07 ENCOUNTER — Other Ambulatory Visit: Payer: Self-pay | Admitting: Family Medicine

## 2021-11-23 ENCOUNTER — Telehealth: Payer: Self-pay | Admitting: Family Medicine

## 2021-11-23 NOTE — Telephone Encounter (Signed)
Called to r/s due to provider schedule change  °

## 2021-11-28 ENCOUNTER — Ambulatory Visit: Payer: BC Managed Care – PPO | Admitting: Family Medicine

## 2021-11-28 ENCOUNTER — Encounter: Payer: Self-pay | Admitting: Family Medicine

## 2021-11-28 VITALS — BP 166/99 | HR 96 | Temp 98.5°F | Resp 16 | Ht 66.0 in | Wt 196.6 lb

## 2021-11-28 DIAGNOSIS — E1169 Type 2 diabetes mellitus with other specified complication: Secondary | ICD-10-CM

## 2021-11-28 DIAGNOSIS — E1159 Type 2 diabetes mellitus with other circulatory complications: Secondary | ICD-10-CM | POA: Diagnosis not present

## 2021-11-28 DIAGNOSIS — E785 Hyperlipidemia, unspecified: Secondary | ICD-10-CM

## 2021-11-28 DIAGNOSIS — E039 Hypothyroidism, unspecified: Secondary | ICD-10-CM | POA: Diagnosis not present

## 2021-11-28 DIAGNOSIS — E1140 Type 2 diabetes mellitus with diabetic neuropathy, unspecified: Secondary | ICD-10-CM | POA: Diagnosis not present

## 2021-11-28 DIAGNOSIS — I152 Hypertension secondary to endocrine disorders: Secondary | ICD-10-CM

## 2021-11-28 LAB — BAYER DCA HB A1C WAIVED: HB A1C (BAYER DCA - WAIVED): 13.2 % — ABNORMAL HIGH (ref 4.8–5.6)

## 2021-11-28 MED ORDER — LOSARTAN POTASSIUM 25 MG PO TABS
25.0000 mg | ORAL_TABLET | Freq: Every day | ORAL | 1 refills | Status: DC
Start: 2021-11-28 — End: 2022-02-11

## 2021-11-28 MED ORDER — EMPAGLIFLOZIN 25 MG PO TABS
25.0000 mg | ORAL_TABLET | Freq: Every day | ORAL | 1 refills | Status: DC
Start: 2021-11-28 — End: 2022-03-18

## 2021-11-28 NOTE — Addendum Note (Signed)
Addended by: Sonny Masters on: 11/28/2021 12:05 PM   Modules accepted: Orders

## 2021-11-28 NOTE — Patient Instructions (Signed)

## 2021-11-28 NOTE — Progress Notes (Signed)
Subjective:  Patient ID: Krista Tate, female    DOB: Jul 06, 1981, 41 y.o.   MRN: [id number]  Patient Care Team: Gwenlyn Perking, FNP as PCP - General (Family Medicine)   Chief Complaint:  Medical Management of Chronic Issues   HPI: Krista Tate is a 40 y.o. female presenting on 11/28/2021 for Medical Management of Chronic Issues   Pt presents for a follow up visit. She discontinued her lisinopril due to cough a month ago. Denies chest pain, trouble breathing, palpitations, and swelling.  Pt is unable to afford a diabetic diet at this time.  She has not been checking her blood sugars. Denies polyuria, polyphagia, or polydipsia. She is taking her statin and tricor as prescribed without associated side effects. She is on thyroid repletion therapy and denies hyper- or hypothyroid symptoms.    Relevant past medical, surgical, family, and social history reviewed and updated as indicated.  Allergies and medications reviewed and updated. Data reviewed: Chart in Epic.   Past Medical History:  Diagnosis Date   Depression    Diabetes mellitus without complication ([place])    Hyperlipidemia    Hypertension    Thyroid disease     Past Surgical History:  Procedure Laterality Date   HYSTEROSCOPY N/A 09/11/2021   Procedure: HYSTEROSCOPY;  Surgeon: Janyth Pupa, DO;  Location: [facility];  Service: Gynecology;  Laterality: N/A;   IUD REMOVAL N/A 09/11/2021   Procedure: INTRAUTERINE DEVICE (IUD) REMOVAL;  Surgeon: Janyth Pupa, DO;  Location: [facility];  Service: Gynecology;  Laterality: N/A;   TUMOR EXCISION     behind right ear   WISDOM TOOTH EXTRACTION      Social History   Socioeconomic History   Marital status: Single    Spouse name: Not on file   Number of children: 0   Years of education: 12   Highest education level: High school graduate  Occupational History   Not on file  Tobacco Use   Smoking status: Never   Smokeless tobacco: Never  Vaping Use   Vaping Use: Never used   Substance and Sexual Activity   Alcohol use: No   Drug use: No   Sexual activity: Not Currently    Birth control/protection: I.U.D., Abstinence  Other Topics Concern   Not on file  Social History Narrative   Not on file   Social Determinants of Health   Financial Resource Strain: Low Risk    Difficulty of Paying Living Expenses: Not very hard  Food Insecurity: No Food Insecurity   Worried About Running Out of Food in the Last Year: Never true   Ran Out of Food in the Last Year: Never true  Transportation Needs: No Transportation Needs   Lack of Transportation (Medical): No   Lack of Transportation (Non-Medical): No  Physical Activity: Insufficiently Active   Days of Exercise per Week: 2 days   Minutes of Exercise per Session: 30 min  Stress: Stress Concern Present   Feeling of Stress : Very much  Social Connections: Socially Isolated   Frequency of Communication with Friends and Family: Once a week   Frequency of Social Gatherings with Friends and Family: Once a week   Attends Religious Services: Never   Marine scientist or Organizations: No   Attends Archivist Meetings: Never   Marital Status: Living with partner  Intimate Partner Violence: Not At Risk   Fear of Current or Ex-Partner: No   Emotionally Abused: No   Physically Abused: No  Sexually Abused: No    Outpatient Encounter Medications as of 11/28/2021  Medication Sig   atorvastatin (LIPITOR) 80 MG tablet Take 1 tablet (80 mg total) by mouth daily.   cholecalciferol (VITAMIN D3) 25 MCG (1000 UNIT) tablet Take 1,000 Units by mouth in the morning and at bedtime.   citalopram (CELEXA) 20 MG tablet Take 1 tablet (20 mg total) by mouth daily.   cyclobenzaprine (FLEXERIL) 10 MG tablet Take 1 tablet (10 mg total) by mouth 3 (three) times daily as needed for muscle spasms.   diphenhydrAMINE (BENADRYL) 25 MG tablet Take 25 mg by mouth every 6 (six) hours as needed for allergies.   fenofibrate (TRICOR)  145 MG tablet Take 145 mg by mouth daily.   ferrous sulfate 324 MG TBEC Take 324 mg by mouth in the morning, at noon, and at bedtime.   glimepiride (AMARYL) 4 MG tablet Take 4 mg by mouth daily with breakfast.   levothyroxine (SYNTHROID) 150 MCG tablet Take 1 tablet (150 mcg total) by mouth daily before breakfast.   metFORMIN (GLUCOPHAGE) 1000 MG tablet TAKE 1 TABLET BY MOUTH TWICE DAILY WITH MEALS   Multiple Minerals (CALCIUM-MAGNESIUM-ZINC) TABS Take 1 tablet by mouth daily.   [DISCONTINUED] benzonatate (TESSALON PERLES) 100 MG capsule Take 1 capsule (100 mg total) by mouth 3 (three) times daily as needed for cough.   [DISCONTINUED] empagliflozin (JARDIANCE) 10 MG TABS tablet Take 1 tablet (10 mg total) by mouth daily.   [DISCONTINUED] levothyroxine (SYNTHROID) 137 MCG tablet Take 137 mcg by mouth daily before breakfast.   [DISCONTINUED] lisinopril (ZESTRIL) 20 MG tablet Take 1 tablet (20 mg total) by mouth daily.   [DISCONTINUED] PARAGARD INTRAUTERINE COPPER IU 1 each by Intrauterine route once.   No facility-administered encounter medications on file as of 11/28/2021.    Allergies  Allergen Reactions   Other Hives and Rash    Metal    Review of Systems  Constitutional:  Negative for activity change, appetite change, chills, diaphoresis, fatigue, fever and unexpected weight change.  Eyes:  Negative for photophobia and visual disturbance.  Respiratory:  Positive for cough. Negative for shortness of breath.   Cardiovascular:  Negative for chest pain, palpitations and leg swelling.  Gastrointestinal:  Negative for abdominal pain.  Endocrine: Negative for cold intolerance, heat intolerance, polydipsia, polyphagia and polyuria.  Genitourinary:  Negative for decreased urine volume and difficulty urinating.  Musculoskeletal:  Negative for arthralgias and myalgias.  Neurological:  Negative for dizziness, tremors, seizures, syncope, facial asymmetry, speech difficulty, weakness,  light-headedness, numbness and headaches.  All other systems reviewed and are negative.      Objective:  There were no vitals taken for this visit.   Wt Readings from Last 3 Encounters:  08/29/21 91.9 kg  08/27/21 91.3 kg  08/21/21 92.8 kg    Physical Exam Vitals and nursing note reviewed.  Constitutional:      Appearance: Normal appearance. She is normal weight.  HENT:     Head: Normocephalic and atraumatic.     Mouth/Throat:     Mouth: Mucous membranes are moist.     Pharynx: No oropharyngeal exudate or posterior oropharyngeal erythema.  Eyes:     Conjunctiva/sclera: Conjunctivae normal.     Pupils: Pupils are equal, round, and reactive to light.  Cardiovascular:     Rate and Rhythm: Normal rate and regular rhythm.     Heart sounds: Normal heart sounds. No murmur heard.   No friction rub. No gallop.  Pulmonary:  Effort: Pulmonary effort is normal.     Breath sounds: Normal breath sounds.  Musculoskeletal:     Right lower leg: No edema.     Left lower leg: No edema.  Skin:    General: Skin is warm and dry.     Capillary Refill: Capillary refill takes less than 2 seconds.  Neurological:     General: No focal deficit present.     Mental Status: She is alert and oriented to person, place, and time.  Psychiatric:        Mood and Affect: Mood normal.        Behavior: Behavior normal.        Thought Content: Thought content normal.        Judgment: Judgment normal.    Results for orders placed or performed during the hospital encounter of 09/11/21  Glucose, capillary  Result Value Ref Range   Glucose-Capillary 240 (H) 70 - 99 mg/dL       Pertinent labs & imaging results that were available during my care of the patient were reviewed by me and considered in my medical decision making.  Assessment & Plan:  Janitza was seen today for medical management of chronic issues.  Diagnoses and all orders for this visit:  Type 2 diabetes mellitus with diabetic  neuropathy, without long-term current use of insulin ([place]) -     Bayer DCA Hb A1c Waived -     CBC with Differential -     CMP14+EGFR - A1C 13.2 today, will add Jardiance $RemoveBefor'25mg'JwszzDBdbHnB$  today. If unable to afford, will seek other medication options for better control of diabetes. Diet and exercise encouraged.  Dyslipidemia associated with type 2 diabetes mellitus ([facility]) -     Lipid Panel. Will check today.  Diet and exercise encouraged. Labs pending.   Hypertension associated with diabetes ([place]) -     CBC with Differential -     CMP14+EGFR -     Will trial losartan $RemoveBefore'25mg'ModpPGOLMkTie$  and follow up in 1 month for blood pressure recheck. DASH diet and exercise encouraged.   Acquired hypothyroidism -     Thyroid Panel With TSH. Will check labs and adjust as indicated.  Will recheck labs and adjust therapy if warranted.    Continue all other maintenance medications.  Follow up plan: Return in about 3 months (around 02/26/2022), or if symptoms worsen or fail to improve.   Continue healthy lifestyle choices, including diet (rich in fruits, vegetables, and lean proteins, and low in salt and simple carbohydrates) and exercise (at least 30 minutes of moderate physical activity daily).  Educational handout given for Diabetes, hypothyroidism, hypertension.   The above assessment and management plan was discussed with the patient. The patient verbalized understanding of and has agreed to the management plan. Patient is aware to call the clinic if they develop any new symptoms or if symptoms persist or worsen. Patient is aware when to return to the clinic for a follow-up visit. Patient educated on when it is appropriate to go to the emergency department.   Deidre Ala, NP-S [facility] [phone]  I personally was present during the history, physical exam, and medical decision-making activities of this visit and have verified that the services and findings are accurately documented in  the nurse practitioner student's note.  Monia Pouch, FNP-C [facility] [address] [place], [place] [address] [phone]

## 2021-11-29 ENCOUNTER — Ambulatory Visit: Payer: BC Managed Care – PPO | Admitting: Family Medicine

## 2021-11-29 LAB — CMP14+EGFR
ALT: 25 IU/L (ref 0–32)
AST: 17 IU/L (ref 0–40)
Albumin/Globulin Ratio: 1.5 (ref 1.2–2.2)
Albumin: 3.9 g/dL (ref 3.8–4.8)
Alkaline Phosphatase: 48 IU/L (ref 44–121)
BUN/Creatinine Ratio: 19 (ref 9–23)
BUN: 15 mg/dL (ref 6–24)
Bilirubin Total: <0.2 mg/dL (ref 0.0–1.2)
CO2: 22 mmol/L (ref 20–29)
Calcium: 9.3 mg/dL (ref 8.7–10.2)
Chloride: 100 mmol/L (ref 96–106)
Creatinine, Ser: 0.77 mg/dL (ref 0.57–1.00)
Globulin, Total: 2.6 g/dL (ref 1.5–4.5)
Glucose: 442 mg/dL (ref 70–99)
Potassium: 4.8 mmol/L (ref 3.5–5.2)
Sodium: 135 mmol/L (ref 134–144)
Total Protein: 6.5 g/dL (ref 6.0–8.5)
eGFR: 99 mL/min/{1.73_m2} (ref >59)

## 2021-11-29 LAB — CBC WITH DIFFERENTIAL/PLATELET
Basophils Absolute: 0.1 10*3/uL (ref 0.0–0.2)
Basos: 1 %
EOS (ABSOLUTE): 0.5 10*3/uL — ABNORMAL HIGH (ref 0.0–0.4)
Eos: 7 %
Hematocrit: 42.1 % (ref 34.0–46.6)
Hemoglobin: 13.5 g/dL (ref 11.1–15.9)
Immature Grans (Abs): 0 10*3/uL (ref 0.0–0.1)
Immature Granulocytes: 0 %
Lymphocytes Absolute: 2.4 10*3/uL (ref 0.7–3.1)
Lymphs: 31 %
MCH: 27.1 pg (ref 26.6–33.0)
MCHC: 32.1 g/dL (ref 31.5–35.7)
MCV: 85 fL (ref 79–97)
Monocytes Absolute: 0.5 10*3/uL (ref 0.1–0.9)
Monocytes: 7 %
Neutrophils Absolute: 4.1 10*3/uL (ref 1.4–7.0)
Neutrophils: 54 %
Platelets: 351 10*3/uL (ref 150–450)
RBC: 4.98 x10E6/uL (ref 3.77–5.28)
RDW: 12.7 % (ref 11.7–15.4)
WBC: 7.6 10*3/uL (ref 3.4–10.8)

## 2021-11-29 LAB — LIPID PANEL
Chol/HDL Ratio: 5.4 ratio — ABNORMAL HIGH (ref 0.0–4.4)
Cholesterol, Total: 182 mg/dL (ref 100–199)
HDL: 34 mg/dL — ABNORMAL LOW (ref >39)
LDL Chol Calc (NIH): 103 mg/dL — ABNORMAL HIGH (ref 0–99)
Triglycerides: 260 mg/dL — ABNORMAL HIGH (ref 0–149)
VLDL Cholesterol Cal: 45 mg/dL — ABNORMAL HIGH (ref 5–40)

## 2021-11-29 LAB — THYROID PANEL WITH TSH
Free Thyroxine Index: 3.5 (ref 1.2–4.9)
T3 Uptake Ratio: 32 % (ref 24–39)
T4, Total: 10.8 ug/dL (ref 4.5–12.0)
TSH: 2.27 u[IU]/mL (ref 0.450–4.500)

## 2021-12-07 ENCOUNTER — Other Ambulatory Visit: Payer: Self-pay | Admitting: Family Medicine

## 2021-12-07 ENCOUNTER — Other Ambulatory Visit: Payer: Self-pay | Admitting: Family

## 2021-12-07 ENCOUNTER — Other Ambulatory Visit: Payer: Self-pay | Admitting: Adult Health

## 2021-12-07 DIAGNOSIS — E1140 Type 2 diabetes mellitus with diabetic neuropathy, unspecified: Secondary | ICD-10-CM

## 2021-12-07 MED ORDER — FENOFIBRATE 145 MG PO TABS: 145.0000 mg | ORAL_TABLET | Freq: Every day | ORAL | 0 refills | Status: DC

## 2021-12-07 MED ORDER — METFORMIN HCL 1000 MG PO TABS: 1000.0000 mg | ORAL_TABLET | Freq: Two times a day (BID) | ORAL | 0 refills | Status: DC

## 2021-12-07 MED ORDER — GLIMEPIRIDE 4 MG PO TABS: 4.0000 mg | ORAL_TABLET | Freq: Every day | ORAL | 0 refills | Status: DC

## 2022-02-07 ENCOUNTER — Other Ambulatory Visit: Payer: Self-pay | Admitting: Family Medicine

## 2022-02-07 DIAGNOSIS — I152 Hypertension secondary to endocrine disorders: Secondary | ICD-10-CM

## 2022-02-27 ENCOUNTER — Encounter: Payer: Self-pay | Admitting: Family Medicine

## 2022-02-27 ENCOUNTER — Ambulatory Visit: Payer: BC Managed Care – PPO | Admitting: Family Medicine

## 2022-03-09 ENCOUNTER — Other Ambulatory Visit: Payer: Self-pay | Admitting: Family Medicine

## 2022-03-09 DIAGNOSIS — E1159 Type 2 diabetes mellitus with other circulatory complications: Secondary | ICD-10-CM

## 2022-03-18 ENCOUNTER — Encounter: Payer: Self-pay | Admitting: Family Medicine

## 2022-03-18 ENCOUNTER — Ambulatory Visit: Payer: BC Managed Care – PPO | Admitting: Family Medicine

## 2022-03-18 VITALS — BP 153/93 | HR 95 | Temp 98.2°F | Ht 66.0 in | Wt 196.2 lb

## 2022-03-18 DIAGNOSIS — F411 Generalized anxiety disorder: Secondary | ICD-10-CM

## 2022-03-18 DIAGNOSIS — E1165 Type 2 diabetes mellitus with hyperglycemia: Secondary | ICD-10-CM | POA: Diagnosis not present

## 2022-03-18 DIAGNOSIS — E039 Hypothyroidism, unspecified: Secondary | ICD-10-CM

## 2022-03-18 DIAGNOSIS — I152 Hypertension secondary to endocrine disorders: Secondary | ICD-10-CM

## 2022-03-18 DIAGNOSIS — E6609 Other obesity due to excess calories: Secondary | ICD-10-CM

## 2022-03-18 DIAGNOSIS — E1169 Type 2 diabetes mellitus with other specified complication: Secondary | ICD-10-CM | POA: Diagnosis not present

## 2022-03-18 DIAGNOSIS — E785 Hyperlipidemia, unspecified: Secondary | ICD-10-CM

## 2022-03-18 DIAGNOSIS — E1159 Type 2 diabetes mellitus with other circulatory complications: Secondary | ICD-10-CM

## 2022-03-18 DIAGNOSIS — E1140 Type 2 diabetes mellitus with diabetic neuropathy, unspecified: Secondary | ICD-10-CM | POA: Diagnosis not present

## 2022-03-18 DIAGNOSIS — F331 Major depressive disorder, recurrent, moderate: Secondary | ICD-10-CM

## 2022-03-18 DIAGNOSIS — Z6832 Body mass index (BMI) 32.0-32.9, adult: Secondary | ICD-10-CM

## 2022-03-18 LAB — BAYER DCA HB A1C WAIVED: HB A1C (BAYER DCA - WAIVED): 13.1 % — ABNORMAL HIGH (ref 4.8–5.6)

## 2022-03-18 MED ORDER — GLIMEPIRIDE 4 MG PO TABS: 4.0000 mg | ORAL_TABLET | Freq: Two times a day (BID) | ORAL | 1 refills | Status: DC

## 2022-03-18 MED ORDER — METFORMIN HCL 1000 MG PO TABS: 1000.0000 mg | ORAL_TABLET | Freq: Two times a day (BID) | ORAL | 1 refills | Status: DC

## 2022-03-18 MED ORDER — LOSARTAN POTASSIUM 50 MG PO TABS: 50.0000 mg | ORAL_TABLET | Freq: Every day | ORAL | 0 refills | Status: DC

## 2022-03-18 MED ORDER — ATORVASTATIN CALCIUM 80 MG PO TABS: 80.0000 mg | ORAL_TABLET | Freq: Every day | ORAL | 1 refills | Status: DC

## 2022-03-18 NOTE — Progress Notes (Signed)
? ?Established Patient Office Visit ? ?Subjective   ?Patient ID: Krista Tate, female    DOB: 1980-11-19  Age: 41 y.o. MRN: [id number] ? ?Chief Complaint  ?Patient presents with  ? Medical Management of Chronic Issues  ? Diabetes  ? Hyperlipidemia  ? Hypertension  ? Hypothyroidism  ? ? ?HPI ? ?DM ?Patient denies foot ulcerations, hyperglycemia, nausea, paresthesia of the feet, polydipsia, polyuria, visual disturbances, and vomiting. ? ?Compliant with meds - did not take jardiance due to cost. She has been taking metformin and glimepiride ? ?Current monitoring regimen: none ?Current diet:  regular ?Current exercise: none ? ?Is She on ACE inhibitor or angiotensin II receptor blocker?  ?Yes, losartan ?Is She on statin? Yes, but has not taken for the last month ? ? ?2. HTN ?Complaint with meds - Yes ?Current Medications - losartan ?Pertinent ROS:  ?Visual Disturbances - No ?Chest pain - No ?Dyspnea - No ?Palpitations - No ?LE edema - No ? ?3. Anxiety/depression ?Reports doing well on Celexa. Does not desire a change at this time.  ? ?  03/18/2022  ? 10:00 AM 08/29/2021  ?  9:11 AM 08/21/2021  ?  4:10 PM  ?Depression screen PHQ 2/9  ?Decreased Interest 0 1 1  ?Down, Depressed, Hopeless 0 1 1  ?PHQ - 2 Score 0 2 2  ?Altered sleeping 2 2 3   ?Tired, decreased energy 1 1 3   ?Change in appetite 0 1 1  ?Feeling bad or failure about yourself  0 1 1  ?Trouble concentrating 0 1 2  ?Moving slowly or fidgety/restless 0 1 0  ?Suicidal thoughts 0 0 1  ?PHQ-9 Score 3 9 13   ?Difficult doing work/chores Not difficult at all Somewhat difficult   ? ? ?  08/29/2021  ?  9:13 AM 08/21/2021  ?  4:10 PM 06/05/2016  ?  8:21 AM 11/02/2015  ? 10:25 AM  ?GAD 7 : Generalized Anxiety Score  ?Nervous, Anxious, on Edge 1 2 2 1   ?Control/stop worrying 3 2 3 3   ?Worry too much - different things 1 2 3 3   ?Trouble relaxing 2 2 3 3   ?Restless 2 3 3 2   ?Easily annoyed or irritable 1 2 3 2   ?Afraid - awful might happen 1 1 2 1   ?Total GAD 7 Score 11 14 19 15    ?Anxiety Difficulty Somewhat difficult  Somewhat difficult Somewhat difficult  ? ? ? ?ROS ?As per HPI.  ?  ?Objective:  ?  ? ?BP (!) 153/93   Pulse 95   Temp 98.2 ?F (36.8 ?C) (Temporal)   Ht 5\' 6"  (1.676 m)   Wt 196 lb 4 oz (89 kg)   SpO2 97%   BMI 31.68 kg/m?  ?BP Readings from Last 3 Encounters:  ?03/18/22 (!) 153/93  ?11/28/21 (!) 166/99  ?09/11/21 125/86  ? ?  ? ?Physical Exam ?Vitals and nursing note reviewed.  ?Constitutional:   ?   General: She is not in acute distress. ?   Appearance: She is not ill-appearing, toxic-appearing or diaphoretic.  ?HENT:  ?   Head: Normocephalic and atraumatic.  ?   Nose: Nose normal.  ?   Mouth/Throat:  ?   Mouth: Mucous membranes are moist.  ?   Pharynx: Oropharynx is clear.  ?Eyes:  ?   General: No scleral icterus. ?   Conjunctiva/sclera: Conjunctivae normal.  ?   Pupils: Pupils are equal, round, and reactive to light.  ?Neck:  ?   Thyroid: No thyroid mass, thyromegaly  or thyroid tenderness.  ?Cardiovascular:  ?   Rate and Rhythm: Normal rate and regular rhythm.  ?   Heart sounds: Normal heart sounds. No murmur heard. ?Pulmonary:  ?   Effort: Pulmonary effort is normal. No respiratory distress.  ?   Breath sounds: Normal breath sounds.  ?Abdominal:  ?   General: Bowel sounds are normal. There is no distension.  ?   Palpations: Abdomen is soft.  ?   Tenderness: There is no abdominal tenderness. There is no guarding or rebound.  ?Musculoskeletal:  ?   Right lower leg: No edema.  ?   Left lower leg: No edema.  ?Skin: ?   General: Skin is warm and dry.  ?Neurological:  ?   General: No focal deficit present.  ?   Mental Status: She is alert and oriented to person, place, and time.  ?Psychiatric:     ?   Mood and Affect: Mood normal.     ?   Behavior: Behavior normal.  ? ? ? ?No results found for any visits on 03/18/22. ? ?Last CBC ?Lab Results  ?Component Value Date  ? WBC 7.6 11/28/2021  ? HGB 13.5 11/28/2021  ? HCT 42.1 11/28/2021  ? MCV 85 11/28/2021  ? MCH 27.1  11/28/2021  ? RDW 12.7 11/28/2021  ? PLT 351 11/28/2021  ? ?Last metabolic panel ?Lab Results  ?Component Value Date  ? GLUCOSE 442 (HH) 11/28/2021  ? NA 135 11/28/2021  ? K 4.8 11/28/2021  ? CL 100 11/28/2021  ? CO2 22 11/28/2021  ? BUN 15 11/28/2021  ? CREATININE 0.77 11/28/2021  ? EGFR 99 11/28/2021  ? CALCIUM 9.3 11/28/2021  ? PROT 6.5 11/28/2021  ? ALBUMIN 3.9 11/28/2021  ? LABGLOB 2.6 11/28/2021  ? AGRATIO 1.5 11/28/2021  ? BILITOT <0.2 11/28/2021  ? ALKPHOS 48 11/28/2021  ? AST 17 11/28/2021  ? ALT 25 11/28/2021  ? ?Last lipids ?Lab Results  ?Component Value Date  ? CHOL 182 11/28/2021  ? HDL 34 (L) 11/28/2021  ? LDLCALC 103 (H) 11/28/2021  ? TRIG 260 (H) 11/28/2021  ? CHOLHDL 5.4 (H) 11/28/2021  ? ?Last hemoglobin A1c ?Lab Results  ?Component Value Date  ? HGBA1C 13.2 (H) 11/28/2021  ? ?  ? ?The 10-year ASCVD risk score (Arnett DK, et al., 2019) is: 4.6% ? ?  ?Assessment & Plan:  ? ?Krista Tate was seen today for medical management of chronic issues, diabetes, hyperlipidemia, hypertension and hypothyroidism. ? ?Diagnoses and all orders for this visit: ? ?Uncontrolled type 2 diabetes mellitus with hyperglycemia ([place]) ?A1c 13.1 today, uncontrolled. Continue metformin. Increase glimepiride to BID. Discussed compliance with meds and diet. Set up appt with Almyra Free for patient assistance. On ARB and statin. Eye exam scheduled. Foot exam UTD.  ?-     Bayer DCA Hb A1c Waived ?-     Microalbumin / creatinine urine ratio ?-     metFORMIN (GLUCOPHAGE) 1000 MG tablet; Take 1 tablet (1,000 mg total) by mouth 2 (two) times daily with a meal. ?-     glimepiride (AMARYL) 4 MG tablet; Take 1 tablet (4 mg total) by mouth 2 (two) times daily with a meal. ? ?Dyslipidemia associated with type 2 diabetes mellitus ([place]) ?Labs pending. Continue lipitor. Discussed compliance.  ?-     Lipid panel ?-     atorvastatin (LIPITOR) 80 MG tablet; Take 1 tablet (80 mg total) by mouth daily. ? ?Hypertension associated with diabetes ([place]) ?Not at  goal today. Increase losartan to  50 mg daily. Labs pending. Asymptomatic.  ?-     CBC with Differential/Platelet ?-     CMP14+EGFR ?-     losartan (COZAAR) 50 MG tablet; Take 1 tablet (50 mg total) by mouth daily. ? ?Acquired hypothyroidism ?On synthroid. Labs pending. ?-     TSH ? ?Depression, major, recurrent, moderate ([place]) ?Generalized anxiety disorder ?Continue clexea. Denies SI. Declined changes in medications or referral today.  ? ?Class 1 obesity due to excess calories with serious comorbidity and body mass index (BMI) of 32.0 to 32.9 in adult ?Diet and exercise.  ? ?Return in about 4 weeks (around 04/15/2022) for with Almyra Free for patient assistance- virtual if possible, 3 months with me.  ? ?The patient indicates understanding of these issues and agrees with the plan. ? ?Gwenlyn Perking, FNP ? ?

## 2022-03-18 NOTE — Patient Instructions (Signed)

## 2022-03-19 LAB — CBC WITH DIFFERENTIAL/PLATELET
Basophils Absolute: 0.1 10*3/uL (ref 0.0–0.2)
Basos: 1 %
EOS (ABSOLUTE): 0.3 10*3/uL (ref 0.0–0.4)
Eos: 4 %
Hematocrit: 42.9 % (ref 34.0–46.6)
Hemoglobin: 14 g/dL (ref 11.1–15.9)
Immature Grans (Abs): 0 10*3/uL (ref 0.0–0.1)
Immature Granulocytes: 0 %
Lymphocytes Absolute: 2.7 10*3/uL (ref 0.7–3.1)
Lymphs: 35 %
MCH: 27.5 pg (ref 26.6–33.0)
MCHC: 32.6 g/dL (ref 31.5–35.7)
MCV: 84 fL (ref 79–97)
Monocytes Absolute: 0.6 10*3/uL (ref 0.1–0.9)
Monocytes: 8 %
Neutrophils Absolute: 4 10*3/uL (ref 1.4–7.0)
Neutrophils: 52 %
Platelets: 381 10*3/uL (ref 150–450)
RBC: 5.1 x10E6/uL (ref 3.77–5.28)
RDW: 12.6 % (ref 11.7–15.4)
WBC: 7.7 10*3/uL (ref 3.4–10.8)

## 2022-03-19 LAB — LIPID PANEL
Chol/HDL Ratio: 8.6 ratio — ABNORMAL HIGH (ref 0.0–4.4)
Cholesterol, Total: 311 mg/dL — ABNORMAL HIGH (ref 100–199)
HDL: 36 mg/dL — ABNORMAL LOW (ref >39)
LDL Chol Calc (NIH): 208 mg/dL — ABNORMAL HIGH (ref 0–99)
Triglycerides: 328 mg/dL — ABNORMAL HIGH (ref 0–149)
VLDL Cholesterol Cal: 67 mg/dL — ABNORMAL HIGH (ref 5–40)

## 2022-03-19 LAB — CMP14+EGFR
ALT: 23 IU/L (ref 0–32)
AST: 18 IU/L (ref 0–40)
Albumin/Globulin Ratio: 1.6 (ref 1.2–2.2)
Albumin: 4.1 g/dL (ref 3.8–4.8)
Alkaline Phosphatase: 41 IU/L — ABNORMAL LOW (ref 44–121)
BUN/Creatinine Ratio: 16 (ref 9–23)
BUN: 14 mg/dL (ref 6–24)
Bilirubin Total: <0.2 mg/dL (ref 0.0–1.2)
CO2: 22 mmol/L (ref 20–29)
Calcium: 9.9 mg/dL (ref 8.7–10.2)
Chloride: 99 mmol/L (ref 96–106)
Creatinine, Ser: 0.87 mg/dL (ref 0.57–1.00)
Globulin, Total: 2.6 g/dL (ref 1.5–4.5)
Glucose: 304 mg/dL — ABNORMAL HIGH (ref 70–99)
Potassium: 5 mmol/L (ref 3.5–5.2)
Sodium: 135 mmol/L (ref 134–144)
Total Protein: 6.7 g/dL (ref 6.0–8.5)
eGFR: 86 mL/min/{1.73_m2} (ref >59)

## 2022-03-19 LAB — MICROALBUMIN / CREATININE URINE RATIO
Creatinine, Urine: 71.7 mg/dL
Microalb/Creat Ratio: 275 mg/g creat — ABNORMAL HIGH (ref 0–29)
Microalbumin, Urine: 197.2 ug/mL

## 2022-03-19 LAB — TSH: TSH: 3.68 u[IU]/mL (ref 0.450–4.500)

## 2022-03-21 ENCOUNTER — Encounter: Payer: Self-pay | Admitting: Emergency Medicine

## 2022-03-21 NOTE — Progress Notes (Signed)
Please call our office to discuss lab results.

## 2022-03-26 ENCOUNTER — Ambulatory Visit: Payer: BC Managed Care – PPO | Admitting: Pharmacist

## 2022-04-10 ENCOUNTER — Other Ambulatory Visit: Payer: Self-pay | Admitting: Family Medicine

## 2022-04-11 LAB — HM DIABETES EYE EXAM

## 2022-04-12 ENCOUNTER — Other Ambulatory Visit: Payer: Self-pay | Admitting: Family Medicine

## 2022-04-12 MED ORDER — CYCLOBENZAPRINE HCL 10 MG PO TABS
10.0000 mg | ORAL_TABLET | Freq: Three times a day (TID) | ORAL | 0 refills | Status: DC | PRN
Start: 2022-04-12 — End: 2022-07-22

## 2022-04-20 IMAGING — MG MM DIGITAL SCREENING BILAT W/ TOMO AND CAD
8 series · 9 of 24 positions shown · non-contrast
Comparison: None.

CLINICAL DATA: Screening.

EXAM:
DIGITAL SCREENING BILATERAL MAMMOGRAM WITH TOMOSYNTHESIS AND CAD
TECHNIQUE: Bilateral screening digital craniocaudal and mediolateral oblique
mammograms were obtained. Bilateral screening digital breast
tomosynthesis was performed. The images were evaluated with
computer-aided detection.

[L MLO synth-2D]
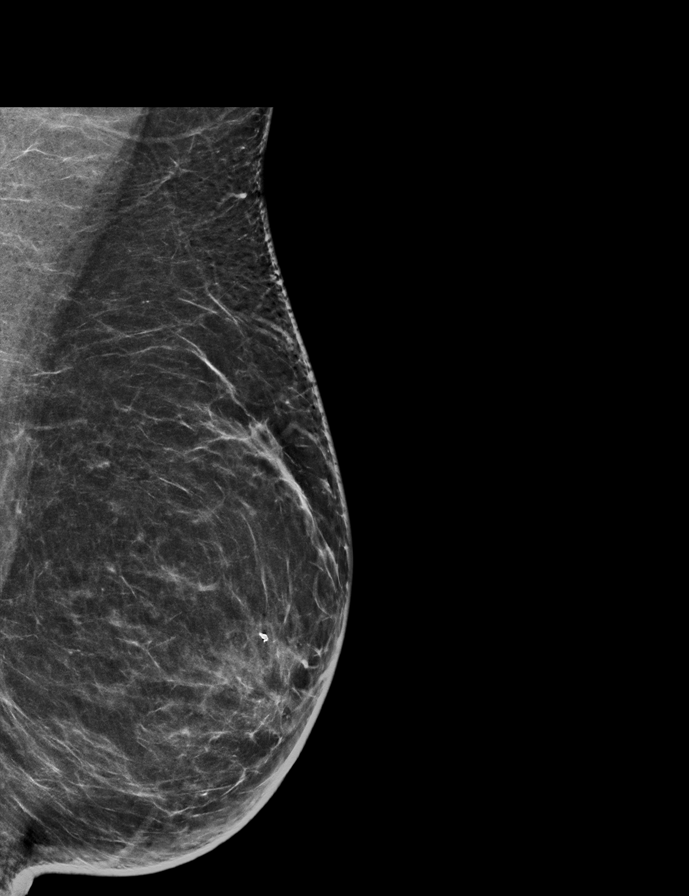

[L CC synth-2D]
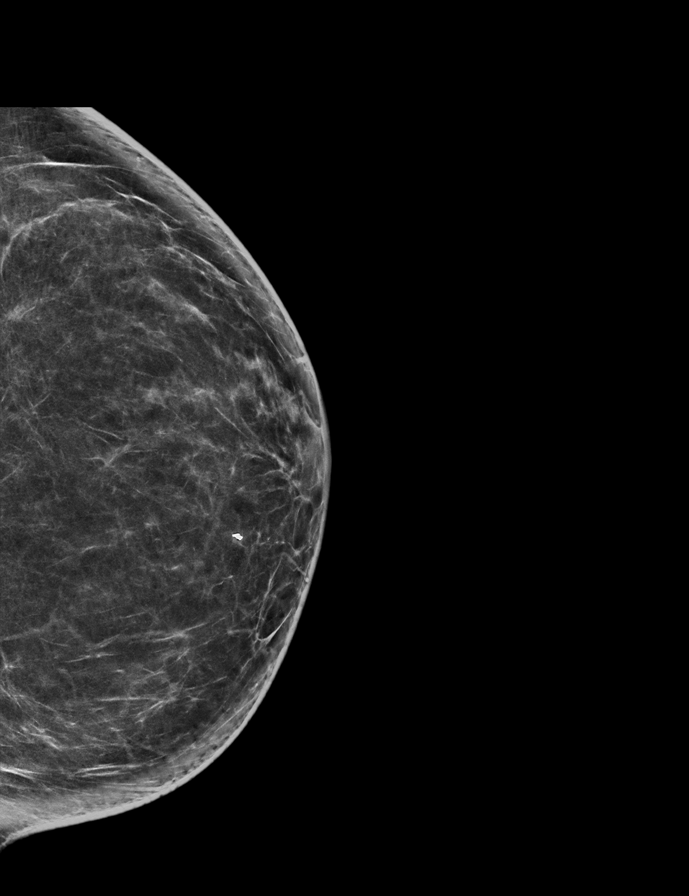

[R MLO synth-2D]
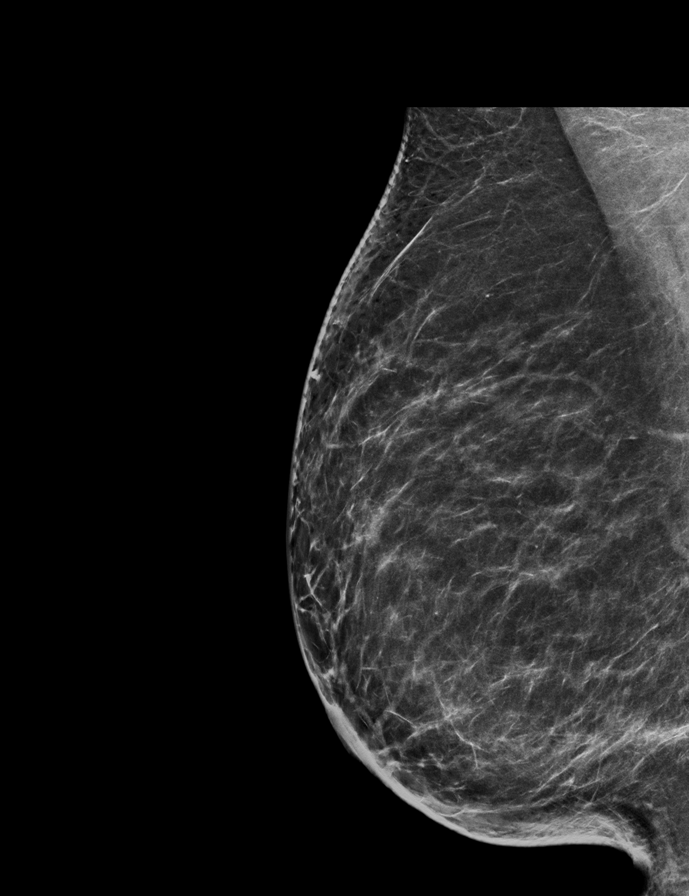

[R CC synth-2D]
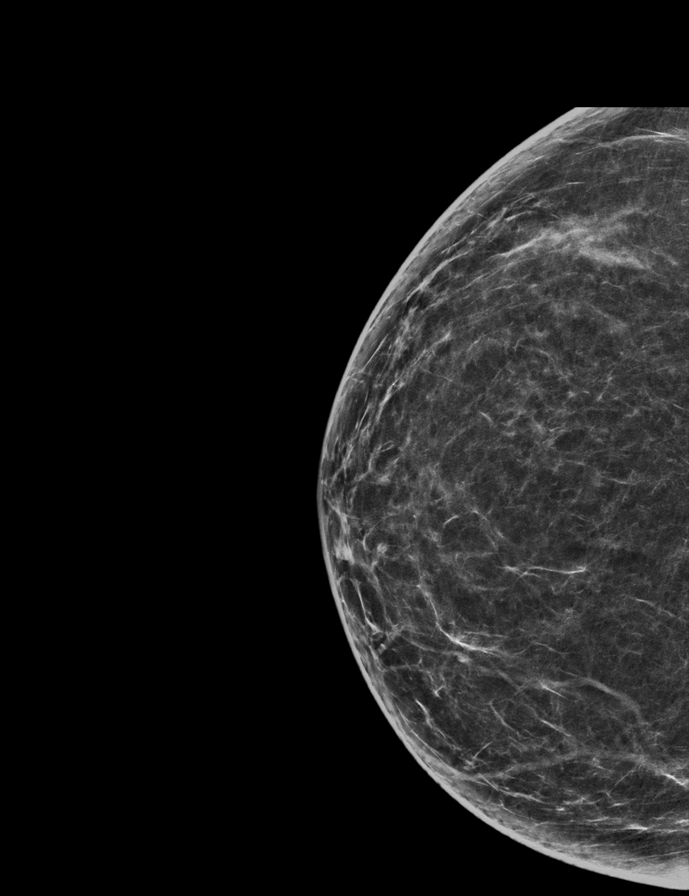

[L MLO tomo · 2 of 71 frames shown]
[frame 23/71]
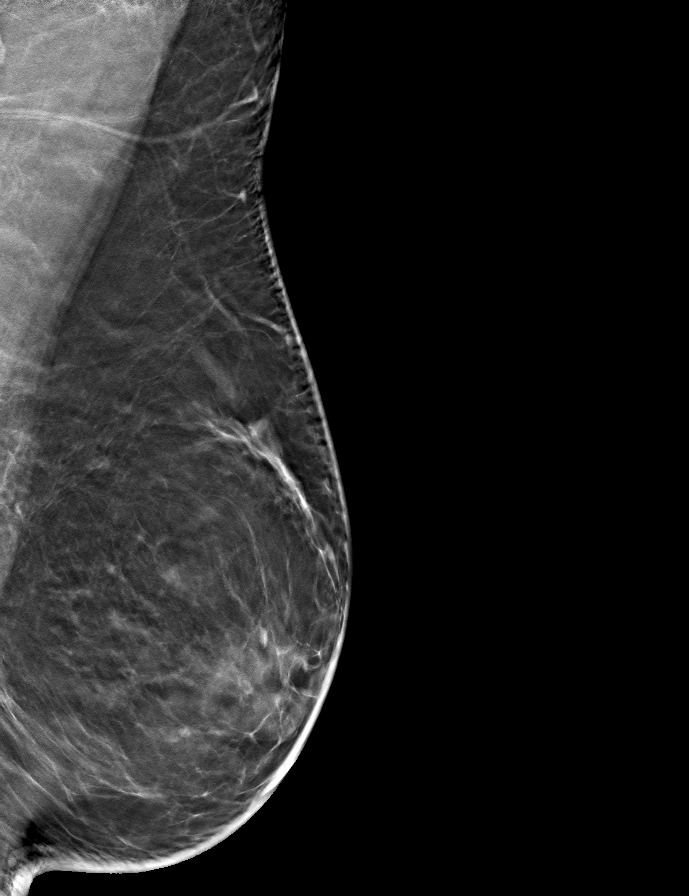
[frame 36/71]
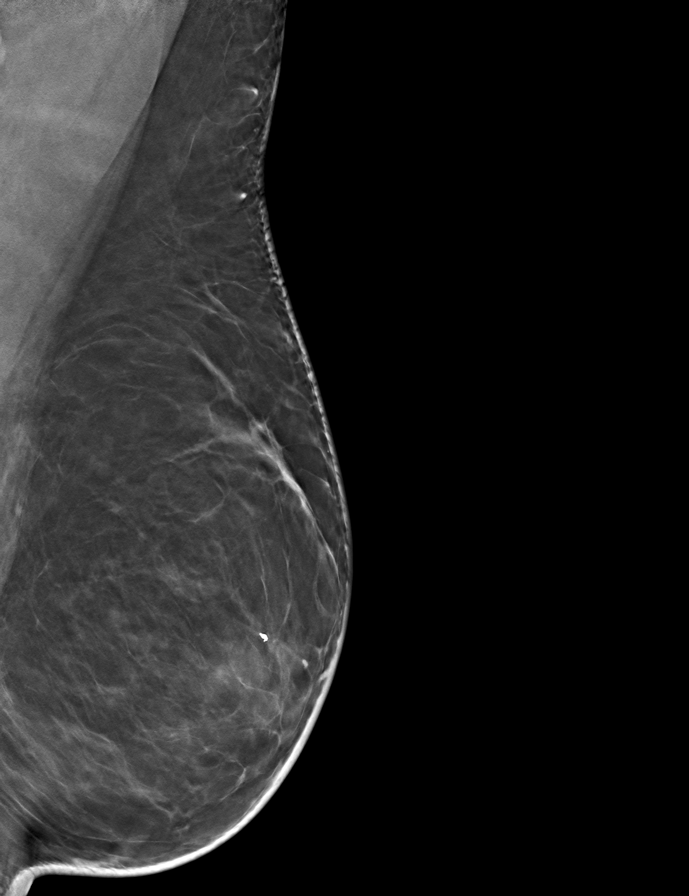

[L CC tomo · tomo slice 36/71.0]
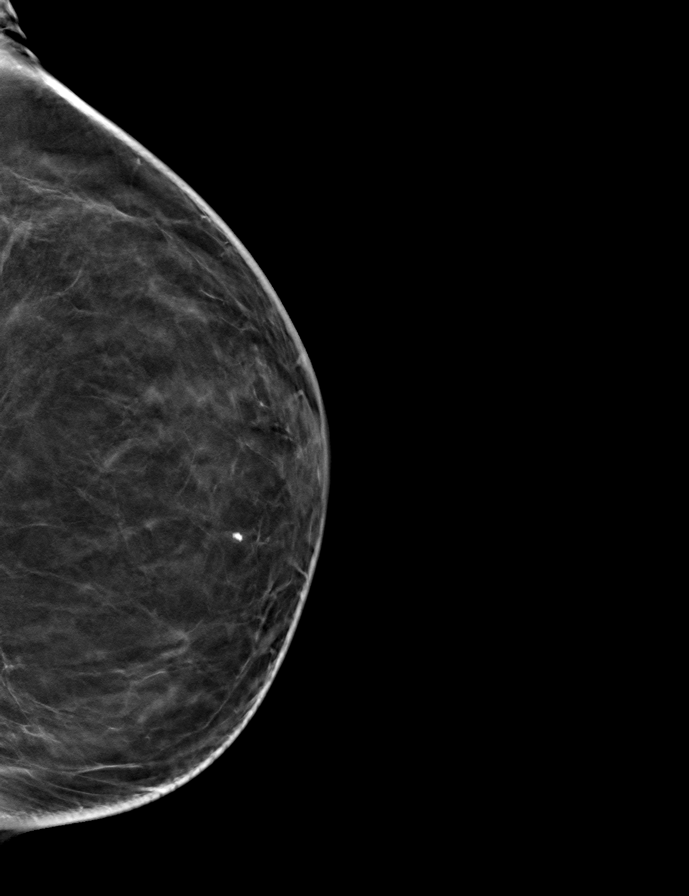

[R CC tomo · tomo slice 35/70.0]
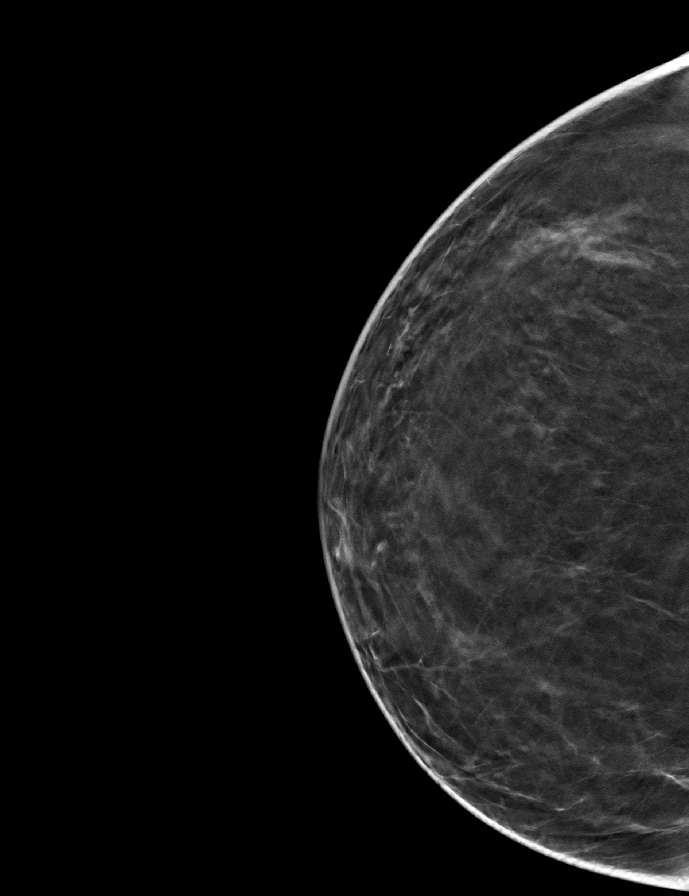

[R MLO tomo · tomo slice 38/75.0]
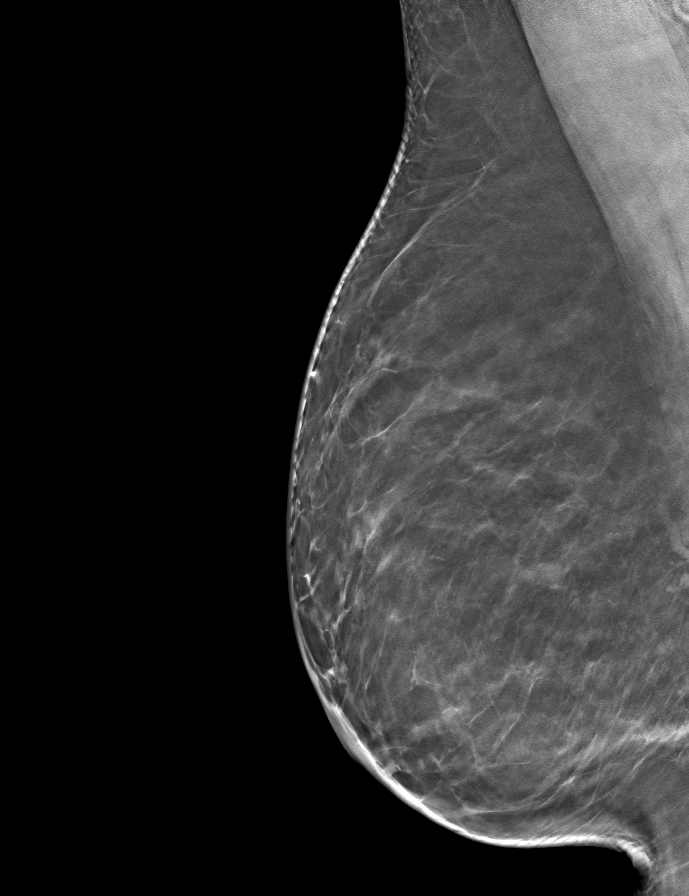

[9 of 24 positions shown; findings below may reference images not displayed]

ACR Breast Density Category b: There are scattered areas of
fibroglandular density.
FINDINGS: There are no findings suspicious for malignancy.
IMPRESSION: No mammographic evidence of malignancy. A result letter of this
screening mammogram will be mailed directly to the patient.

RECOMMENDATION:
Screening mammogram in one year. (Code:XG-X-X7B)

BI-RADS CATEGORY  1: Negative.

## 2022-05-08 ENCOUNTER — Other Ambulatory Visit: Payer: Self-pay | Admitting: Adult Health

## 2022-05-14 ENCOUNTER — Ambulatory Visit (INDEPENDENT_AMBULATORY_CARE_PROVIDER_SITE_OTHER): Payer: BC Managed Care – PPO | Admitting: Pharmacist

## 2022-05-14 DIAGNOSIS — E1165 Type 2 diabetes mellitus with hyperglycemia: Secondary | ICD-10-CM

## 2022-05-17 NOTE — Progress Notes (Signed)
    05/17/2022 Name: Krista Tate MRN: [id number] DOB: 12-Oct-1981   S:  19 yoF Presents for diabetes evaluation, education, and management Patient was referred and last seen by Primary Care Provider last month.  Patient has been able to obtain additional medications for T2DM likely due to insurance plan.  Insurance coverage/medication affordability: BCBS high deductible  Patient reports adherence with medications. Current diabetes medications include: metformin/glimepiride Current hypertension medications include: losartan Goal 130/80 Current hyperlipidemia medications include: atorvastatin   Patient-reported exercise habits: n/a; encouraged   O:  Lab Results  Component Value Date   HGBA1C 13.1 (H) 03/18/2022    Lipid Panel     Component Value Date/Time   CHOL 311 (H) 03/18/2022 0949   TRIG 328 (H) 03/18/2022 0949   TRIG 248 (H) 03/15/2015 1028   HDL 36 (L) 03/18/2022 0949   HDL 36 (L) 03/15/2015 1028   CHOLHDL 8.6 (H) 03/18/2022 0949   LDLCALC 208 (H) 03/18/2022 0949     Home fasting blood sugars: n/a  2 hour post-meal/random blood sugars: n/a.    Clinical Atherosclerotic Cardiovascular Disease (ASCVD): No   The 10-year ASCVD risk score (Arnett DK, et al., 2019) is: 11.5%   Values used to calculate the score:     Age: 41 years     Sex: Female     Is Non-Hispanic African American: No     Diabetic: Yes     Tobacco smoker: No     Systolic Blood Pressure: 153 mmHg     Is BP treated: Yes     HDL Cholesterol: 36 mg/dL     Total Cholesterol: 311 mg/dL    A/P:  Diabetes I2LN currently UNCONTROLLED. Patient is adherent with medication. Control is suboptimal due to INSURANCE/HIGH DEDUCTIBLE.  Also, noted intolerance to trulicity (updated intolerance list) and Januvia and Jardiance both were not affordable.  -JARDIANCE SAMPLES GIVEN to bridge supply  Encouraged patient to reach out to insurance to determine affordability  -Continue  metformin/glimepiride  -Consider insulin (can get for $30/month), but patient does not wish to be on insulin at this time.  -Extensively discussed pathophysiology of diabetes, recommended lifestyle interventions, dietary effects on blood sugar control  -Counseled on s/sx of and management of hypoglycemia  -Next A1C anticipated 3 months .   Written patient instructions provided.  Total time in face to face counseling 25 minutes.    Kieth Brightly, PharmD, BCPS Clinical Pharmacist, [facility]  II Phone [phone]

## 2022-06-17 ENCOUNTER — Encounter: Payer: Self-pay | Admitting: Family Medicine

## 2022-06-17 ENCOUNTER — Ambulatory Visit: Payer: BC Managed Care – PPO | Admitting: Family Medicine

## 2022-06-17 VITALS — BP 135/87 | HR 96 | Temp 97.9°F | Ht 66.0 in | Wt 193.2 lb

## 2022-06-17 DIAGNOSIS — I152 Hypertension secondary to endocrine disorders: Secondary | ICD-10-CM

## 2022-06-17 DIAGNOSIS — E1165 Type 2 diabetes mellitus with hyperglycemia: Secondary | ICD-10-CM | POA: Diagnosis not present

## 2022-06-17 DIAGNOSIS — E1159 Type 2 diabetes mellitus with other circulatory complications: Secondary | ICD-10-CM

## 2022-06-17 DIAGNOSIS — E039 Hypothyroidism, unspecified: Secondary | ICD-10-CM | POA: Diagnosis not present

## 2022-06-17 DIAGNOSIS — E1169 Type 2 diabetes mellitus with other specified complication: Secondary | ICD-10-CM | POA: Diagnosis not present

## 2022-06-17 DIAGNOSIS — E6609 Other obesity due to excess calories: Secondary | ICD-10-CM | POA: Diagnosis not present

## 2022-06-17 DIAGNOSIS — Z6832 Body mass index (BMI) 32.0-32.9, adult: Secondary | ICD-10-CM

## 2022-06-17 DIAGNOSIS — E785 Hyperlipidemia, unspecified: Secondary | ICD-10-CM

## 2022-06-17 DIAGNOSIS — R809 Proteinuria, unspecified: Secondary | ICD-10-CM

## 2022-06-17 DIAGNOSIS — E1129 Type 2 diabetes mellitus with other diabetic kidney complication: Secondary | ICD-10-CM

## 2022-06-17 LAB — CMP14+EGFR
ALT: 19 IU/L (ref 0–32)
AST: 19 IU/L (ref 0–40)
Albumin/Globulin Ratio: 1.7 (ref 1.2–2.2)
Albumin: 4.2 g/dL (ref 3.9–4.9)
Alkaline Phosphatase: 41 IU/L — ABNORMAL LOW (ref 44–121)
BUN/Creatinine Ratio: 21 (ref 9–23)
BUN: 17 mg/dL (ref 6–24)
Bilirubin Total: 0.2 mg/dL (ref 0.0–1.2)
CO2: 21 mmol/L (ref 20–29)
Calcium: 8.2 mg/dL — ABNORMAL LOW (ref 8.7–10.2)
Chloride: 97 mmol/L (ref 96–106)
Creatinine, Ser: 0.82 mg/dL (ref 0.57–1.00)
Globulin, Total: 2.5 g/dL (ref 1.5–4.5)
Glucose: 326 mg/dL — ABNORMAL HIGH (ref 70–99)
Potassium: 5.2 mmol/L (ref 3.5–5.2)
Sodium: 133 mmol/L — ABNORMAL LOW (ref 134–144)
Total Protein: 6.7 g/dL (ref 6.0–8.5)
eGFR: 92 mL/min/{1.73_m2} (ref >59)

## 2022-06-17 LAB — CBC WITH DIFFERENTIAL/PLATELET
Basophils Absolute: 0.1 10*3/uL (ref 0.0–0.2)
Basos: 1 %
EOS (ABSOLUTE): 0.3 10*3/uL (ref 0.0–0.4)
Eos: 4 %
Hematocrit: 42.5 % (ref 34.0–46.6)
Hemoglobin: 14.1 g/dL (ref 11.1–15.9)
Immature Grans (Abs): 0 10*3/uL (ref 0.0–0.1)
Immature Granulocytes: 0 %
Lymphocytes Absolute: 2.2 10*3/uL (ref 0.7–3.1)
Lymphs: 29 %
MCH: 27.5 pg (ref 26.6–33.0)
MCHC: 33.2 g/dL (ref 31.5–35.7)
MCV: 83 fL (ref 79–97)
Monocytes Absolute: 0.6 10*3/uL (ref 0.1–0.9)
Monocytes: 7 %
Neutrophils Absolute: 4.5 10*3/uL (ref 1.4–7.0)
Neutrophils: 59 %
Platelets: 426 10*3/uL (ref 150–450)
RBC: 5.13 x10E6/uL (ref 3.77–5.28)
RDW: 12.6 % (ref 11.7–15.4)
WBC: 7.7 10*3/uL (ref 3.4–10.8)

## 2022-06-17 LAB — BAYER DCA HB A1C WAIVED: HB A1C (BAYER DCA - WAIVED): 13.7 % — ABNORMAL HIGH (ref 4.8–5.6)

## 2022-06-17 LAB — LIPID PANEL
Chol/HDL Ratio: 9.2 ratio — ABNORMAL HIGH (ref 0.0–4.4)
Cholesterol, Total: 331 mg/dL — ABNORMAL HIGH (ref 100–199)
HDL: 36 mg/dL — ABNORMAL LOW (ref >39)
LDL Chol Calc (NIH): 210 mg/dL — ABNORMAL HIGH (ref 0–99)
Triglycerides: 403 mg/dL — ABNORMAL HIGH (ref 0–149)
VLDL Cholesterol Cal: 85 mg/dL — ABNORMAL HIGH (ref 5–40)

## 2022-06-17 LAB — TSH: TSH: 4.41 u[IU]/mL (ref 0.450–4.500)

## 2022-06-17 MED ORDER — LANTUS SOLOSTAR 100 UNIT/ML ~~LOC~~ SOPN: 10.0000 [IU] | PEN_INJECTOR | Freq: Every day | SUBCUTANEOUS | 1 refills | Status: DC

## 2022-06-17 NOTE — Progress Notes (Signed)
Established Patient Office Visit  Subjective   Patient ID: Krista Tate, female    DOB: 1981-08-27  Age: 41 y.o. MRN: [id number]  Chief Complaint  Patient presents with   Medical Management of Chronic Issues   Hyperlipidemia   Hypertension   Hypothyroidism   Diabetes    DM Patient denies foot ulcerations, hyperglycemia, nausea, paresthesia of the feet, polydipsia, polyuria, visual disturbances, and vomiting.  Compliant with meds - did not take jardiance due to cost. She has been taking metformin and glimepiride  Current monitoring regimen: none Current diet:  regular Current exercise: none  Is She on ACE inhibitor or angiotensin II receptor blocker?  Yes, losartan Is She on statin? No she has not been taking this due to the cost. Reports it costs 30 dollars a month.   2. HTN Complaint with meds - Yes Current Medications - losartan Pertinent ROS:  Visual Disturbances - No Chest pain - No Dyspnea - No Palpitations - No LE edema - No  3. Anxiety/depression Reports doing well on Celexa. Does not desire a change at this time.     06/17/2022   10:02 AM 03/18/2022   10:00 AM 08/29/2021    9:11 AM  Depression screen PHQ 2/9  Decreased Interest 1 0 1  Down, Depressed, Hopeless 1 0 1  PHQ - 2 Score 2 0 2  Altered sleeping 2 2 2   Tired, decreased energy 1 1 1   Change in appetite 1 0 1  Feeling bad or failure about yourself  0 0 1  Trouble concentrating 0 0 1  Moving slowly or fidgety/restless 0 0 1  Suicidal thoughts 0 0 0  PHQ-9 Score 6 3 9   Difficult doing work/chores Somewhat difficult Not difficult at all Somewhat difficult      06/17/2022   10:02 AM 08/29/2021    9:13 AM 08/21/2021    4:10 PM 06/05/2016    8:21 AM  GAD 7 : Generalized Anxiety Score  Nervous, Anxious, on Edge 2 1 2 2   Control/stop worrying 2 3 2 3   Worry too much - different things 1 1 2 3   Trouble relaxing 2 2 2 3   Restless 1 2 3 3   Easily annoyed or irritable 1 1 2 3   Afraid - awful  might happen 0 1 1 2   Total GAD 7 Score 9 11 14 19   Anxiety Difficulty Somewhat difficult Somewhat difficult  Somewhat difficult     ROS As per HPI.    Objective:     BP 135/87   Pulse 96   Temp 97.9 F (36.6 C) (Temporal)   Ht 5\' 6"  (1.676 m)   Wt 193 lb 4 oz (87.7 kg)   SpO2 98%   BMI 31.19 kg/m  BP Readings from Last 3 Encounters:  06/17/22 135/87  03/18/22 (!) 153/93  11/28/21 (!) 166/99      Physical Exam Vitals and nursing note reviewed.  Constitutional:      General: She is not in acute distress.    Appearance: She is not ill-appearing, toxic-appearing or diaphoretic.  HENT:     Head: Normocephalic and atraumatic.     Nose: Nose normal.     Mouth/Throat:     Mouth: Mucous membranes are moist.     Pharynx: Oropharynx is clear.  Eyes:     General: No scleral icterus.    Conjunctiva/sclera: Conjunctivae normal.     Pupils: Pupils are equal, round, and reactive to light.  Neck:  Thyroid: No thyroid mass, thyromegaly or thyroid tenderness.  Cardiovascular:     Rate and Rhythm: Normal rate and regular rhythm.     Heart sounds: Normal heart sounds. No murmur heard. Pulmonary:     Effort: Pulmonary effort is normal. No respiratory distress.     Breath sounds: Normal breath sounds.  Abdominal:     General: Bowel sounds are normal. There is no distension.     Palpations: Abdomen is soft.     Tenderness: There is no abdominal tenderness. There is no guarding or rebound.  Musculoskeletal:     Right lower leg: No edema.     Left lower leg: No edema.  Skin:    General: Skin is warm and dry.  Neurological:     General: No focal deficit present.     Mental Status: She is alert and oriented to person, place, and time.  Psychiatric:        Mood and Affect: Mood normal.        Behavior: Behavior normal.      No results found for any visits on 06/17/22.  Last CBC Lab Results  Component Value Date   WBC 7.7 03/18/2022   HGB 14.0 03/18/2022   HCT 42.9  03/18/2022   MCV 84 03/18/2022   MCH 27.5 03/18/2022   RDW 12.6 03/18/2022   PLT 381 46/27/0350   Last metabolic panel Lab Results  Component Value Date   GLUCOSE 304 (H) 03/18/2022   NA 135 03/18/2022   K 5.0 03/18/2022   CL 99 03/18/2022   CO2 22 03/18/2022   BUN 14 03/18/2022   CREATININE 0.87 03/18/2022   EGFR 86 03/18/2022   CALCIUM 9.9 03/18/2022   PROT 6.7 03/18/2022   ALBUMIN 4.1 03/18/2022   LABGLOB 2.6 03/18/2022   AGRATIO 1.6 03/18/2022   BILITOT <0.2 03/18/2022   ALKPHOS 41 (L) 03/18/2022   AST 18 03/18/2022   ALT 23 03/18/2022   Last lipids Lab Results  Component Value Date   CHOL 311 (H) 03/18/2022   HDL 36 (L) 03/18/2022   LDLCALC 208 (H) 03/18/2022   TRIG 328 (H) 03/18/2022   CHOLHDL 8.6 (H) 03/18/2022   Last hemoglobin A1c Lab Results  Component Value Date   HGBA1C 13.1 (H) 03/18/2022      The 10-year ASCVD risk score (Arnett DK, et al., 2019) is: 9.1%    Assessment & Plan:   Asher was seen today for medical management of chronic issues, hyperlipidemia, hypertension, hypothyroidism and diabetes.  Diagnoses and all orders for this visit:  Uncontrolled type 2 diabetes mellitus with hyperglycemia ([place]) Uncontrolled. A1c is 13.7 today. Limited to options due to cost of medications with high deductible plan. Continue metformin and glimepiride. She is not agreeable to meal time insulin but does agree to try basal insulin. Sample given today in office. Lantus ordered. Will start at 10 units nights and have her follow up Almyra Free in about 4 weeks.  -     Microalbumin / creatinine urine ratio -     Bayer DCA Hb A1c Waived -     Ambulatory referral to Nephrology -     insulin glargine (LANTUS SOLOSTAR) 100 UNIT/ML Solostar Pen; Inject 10 Units into the skin at bedtime.  Dyslipidemia associated with type 2 diabetes mellitus ([place]) Stopped statin due to cost. Labs pending. Diet and exercise.  -     Lipid panel  Acquired hypothyroidism On synthroid.  Labs pending.  -     TSH  Class 1 obesity due to excess calories with serious comorbidity and body mass index (BMI) of 32.0 to 32.9 in adult Diet an exercise.   Hypertension associated with diabetes ([place]) Well controlled on current regimen. Labs pending.  -     CBC with Differential/Platelet -     CMP14+EGFR  Microalbuminuria due to type 2 diabetes mellitus [facility]) Referral to nephrology today. She is on an ARB. Unable to afford farxiga.  -     Ambulatory referral to Nephrology   Return in about 3 months (around 09/17/2022) for chronic follow up.   The patient indicates understanding of these issues and agrees with the plan.  Gwenlyn Perking, FNP

## 2022-06-18 LAB — MICROALBUMIN / CREATININE URINE RATIO
Creatinine, Urine: 73.9 mg/dL
Microalb/Creat Ratio: 332 mg/g creat — ABNORMAL HIGH (ref 0–29)
Microalbumin, Urine: 245.1 ug/mL

## 2022-06-20 ENCOUNTER — Other Ambulatory Visit: Payer: Self-pay | Admitting: *Deleted

## 2022-06-20 ENCOUNTER — Other Ambulatory Visit: Payer: Self-pay | Admitting: Family Medicine

## 2022-06-20 DIAGNOSIS — E1129 Type 2 diabetes mellitus with other diabetic kidney complication: Secondary | ICD-10-CM

## 2022-06-20 MED ORDER — DAPAGLIFLOZIN PROPANEDIOL 10 MG PO TABS: 10.0000 mg | ORAL_TABLET | Freq: Every day | ORAL | 5 refills | Status: DC

## 2022-06-20 NOTE — Progress Notes (Signed)
Opened in error

## 2022-07-16 ENCOUNTER — Telehealth: Payer: Self-pay

## 2022-07-16 NOTE — Telephone Encounter (Signed)
[name] switched her to lantus (glargine) recently. Have her check on the price of that to replace tresiba.

## 2022-07-16 NOTE — Telephone Encounter (Signed)
Pt aware by vm. 

## 2022-07-16 NOTE — Telephone Encounter (Signed)
Patient called - she is not able to afford her Krista Tate and would like to know if there is an alternative she can try. Please advise

## 2022-07-17 ENCOUNTER — Other Ambulatory Visit: Payer: Self-pay | Admitting: *Deleted

## 2022-07-17 DIAGNOSIS — E1165 Type 2 diabetes mellitus with hyperglycemia: Secondary | ICD-10-CM

## 2022-07-17 DIAGNOSIS — E1169 Type 2 diabetes mellitus with other specified complication: Secondary | ICD-10-CM

## 2022-07-17 DIAGNOSIS — I152 Hypertension secondary to endocrine disorders: Secondary | ICD-10-CM

## 2022-07-17 DIAGNOSIS — E039 Hypothyroidism, unspecified: Secondary | ICD-10-CM

## 2022-07-17 MED ORDER — ATORVASTATIN CALCIUM 80 MG PO TABS: 80.0000 mg | ORAL_TABLET | Freq: Every day | ORAL | 0 refills | Status: DC

## 2022-07-17 MED ORDER — LOSARTAN POTASSIUM 50 MG PO TABS: 50.0000 mg | ORAL_TABLET | Freq: Every day | ORAL | 0 refills | Status: DC

## 2022-07-17 MED ORDER — LEVOTHYROXINE SODIUM 150 MCG PO TABS: 150.0000 ug | ORAL_TABLET | Freq: Every day | ORAL | 3 refills | Status: DC

## 2022-07-17 MED ORDER — METFORMIN HCL 1000 MG PO TABS: 1000.0000 mg | ORAL_TABLET | Freq: Two times a day (BID) | ORAL | 0 refills | Status: DC

## 2022-07-17 MED ORDER — GLIMEPIRIDE 4 MG PO TABS: 4.0000 mg | ORAL_TABLET | Freq: Two times a day (BID) | ORAL | 0 refills | Status: DC

## 2022-07-17 MED ORDER — FENOFIBRATE 145 MG PO TABS: 145.0000 mg | ORAL_TABLET | Freq: Every day | ORAL | 0 refills | Status: DC

## 2022-07-22 ENCOUNTER — Other Ambulatory Visit: Payer: Self-pay | Admitting: *Deleted

## 2022-07-24 ENCOUNTER — Encounter: Payer: Self-pay | Admitting: Family Medicine

## 2022-07-24 ENCOUNTER — Other Ambulatory Visit: Payer: Self-pay | Admitting: *Deleted

## 2022-07-24 MED ORDER — CYCLOBENZAPRINE HCL 10 MG PO TABS: 10.0000 mg | ORAL_TABLET | Freq: Three times a day (TID) | ORAL | 0 refills | Status: DC | PRN

## 2022-08-14 ENCOUNTER — Other Ambulatory Visit (HOSPITAL_COMMUNITY): Payer: Self-pay | Admitting: Nephrology

## 2022-08-14 DIAGNOSIS — I129 Hypertensive chronic kidney disease with stage 1 through stage 4 chronic kidney disease, or unspecified chronic kidney disease: Secondary | ICD-10-CM | POA: Diagnosis not present

## 2022-08-14 DIAGNOSIS — E1129 Type 2 diabetes mellitus with other diabetic kidney complication: Secondary | ICD-10-CM

## 2022-08-14 DIAGNOSIS — R809 Proteinuria, unspecified: Secondary | ICD-10-CM | POA: Diagnosis not present

## 2022-08-14 DIAGNOSIS — N189 Chronic kidney disease, unspecified: Secondary | ICD-10-CM | POA: Diagnosis not present

## 2022-08-14 DIAGNOSIS — E1122 Type 2 diabetes mellitus with diabetic chronic kidney disease: Secondary | ICD-10-CM

## 2022-08-14 DIAGNOSIS — E871 Hypo-osmolality and hyponatremia: Secondary | ICD-10-CM | POA: Diagnosis not present

## 2022-09-02 ENCOUNTER — Ambulatory Visit (HOSPITAL_COMMUNITY)
Admission: RE | Admit: 2022-09-02 | Discharge: 2022-09-02 | Disposition: A | Discharge status: Home / Self Care | Payer: BC Managed Care – PPO | Source: Ambulatory Visit | Attending: Nephrology | Admitting: Nephrology

## 2022-09-02 DIAGNOSIS — E1122 Type 2 diabetes mellitus with diabetic chronic kidney disease: Secondary | ICD-10-CM

## 2022-09-02 DIAGNOSIS — E1129 Type 2 diabetes mellitus with other diabetic kidney complication: Secondary | ICD-10-CM | POA: Diagnosis not present

## 2022-09-02 DIAGNOSIS — Z1159 Encounter for screening for other viral diseases: Secondary | ICD-10-CM | POA: Diagnosis not present

## 2022-09-02 DIAGNOSIS — R809 Proteinuria, unspecified: Secondary | ICD-10-CM | POA: Insufficient documentation

## 2022-09-02 DIAGNOSIS — N189 Chronic kidney disease, unspecified: Secondary | ICD-10-CM | POA: Diagnosis not present

## 2022-09-02 DIAGNOSIS — Z79899 Other long term (current) drug therapy: Secondary | ICD-10-CM | POA: Diagnosis not present

## 2022-09-02 DIAGNOSIS — I129 Hypertensive chronic kidney disease with stage 1 through stage 4 chronic kidney disease, or unspecified chronic kidney disease: Secondary | ICD-10-CM | POA: Diagnosis not present

## 2022-09-23 ENCOUNTER — Ambulatory Visit: Payer: BC Managed Care – PPO | Admitting: Family Medicine

## 2022-09-23 ENCOUNTER — Encounter: Payer: Self-pay | Admitting: Family Medicine

## 2022-09-23 VITALS — BP 148/90 | HR 90 | Temp 97.6°F | Ht 66.0 in | Wt 204.1 lb

## 2022-09-23 DIAGNOSIS — R809 Proteinuria, unspecified: Secondary | ICD-10-CM

## 2022-09-23 DIAGNOSIS — E039 Hypothyroidism, unspecified: Secondary | ICD-10-CM | POA: Diagnosis not present

## 2022-09-23 DIAGNOSIS — E785 Hyperlipidemia, unspecified: Secondary | ICD-10-CM | POA: Diagnosis not present

## 2022-09-23 DIAGNOSIS — Z6832 Body mass index (BMI) 32.0-32.9, adult: Secondary | ICD-10-CM

## 2022-09-23 DIAGNOSIS — E1169 Type 2 diabetes mellitus with other specified complication: Secondary | ICD-10-CM | POA: Diagnosis not present

## 2022-09-23 DIAGNOSIS — E1129 Type 2 diabetes mellitus with other diabetic kidney complication: Secondary | ICD-10-CM

## 2022-09-23 DIAGNOSIS — E6609 Other obesity due to excess calories: Secondary | ICD-10-CM

## 2022-09-23 DIAGNOSIS — I152 Hypertension secondary to endocrine disorders: Secondary | ICD-10-CM

## 2022-09-23 DIAGNOSIS — E1159 Type 2 diabetes mellitus with other circulatory complications: Secondary | ICD-10-CM | POA: Diagnosis not present

## 2022-09-23 DIAGNOSIS — E66811 Obesity, class 1: Secondary | ICD-10-CM

## 2022-09-23 DIAGNOSIS — F331 Major depressive disorder, recurrent, moderate: Secondary | ICD-10-CM

## 2022-09-23 DIAGNOSIS — E1165 Type 2 diabetes mellitus with hyperglycemia: Secondary | ICD-10-CM

## 2022-09-23 DIAGNOSIS — F411 Generalized anxiety disorder: Secondary | ICD-10-CM

## 2022-09-23 MED ORDER — CITALOPRAM HYDROBROMIDE 20 MG PO TABS: 20.0000 mg | ORAL_TABLET | Freq: Every day | ORAL | 3 refills | Status: DC

## 2022-09-23 NOTE — Patient Instructions (Addendum)
24 hour insulin generic names: detemir, glargine, degludec 24 hour insuin brand names: levemir, lantus, basaglar, semglee, toujeo, tresiba, abasaglar  Increase tresiba to 14 units daily.   Follow up with Raynelle Fanning in 4 weeks, 3 months with me.

## 2022-09-23 NOTE — Progress Notes (Signed)
Established Patient Office Visit  Subjective   Patient ID: Krista Tate, female    DOB: [birth year]  Age: 41 y.o. MRN: [id number]  Chief Complaint  Patient presents with   Medical Management of Chronic Issues   Diabetes    DM Patient denies foot ulcerations, hyperglycemia, nausea, paresthesia of the feet, polydipsia, polyuria, visual disturbances, and vomiting.  Compliant with meds - metformin and glimepiride. She has been tresiba 200u dosage at 10 units nights. She has been using samples of this.   She has found out that her insurance will cover novolog. She did not check on a basal insulin coverage   Current monitoring regimen: none Current diet:  regular Current exercise: none  Is She on ACE inhibitor or angiotensin II receptor blocker?  Yes, losartan Is She on statin? Yes restarted in September  2. HTN Complaint with meds - Yes Current Medications - losartan 50 mg Pertinent ROS:  Visual Disturbances - No Chest pain - No Dyspnea - No Palpitations - No LE edema - No  3. Hypothyroid Compliant with medications - Yes Current medications - levothyroxine 150 mcg Weight - has gained weight since last visit  Bowel habit changes - No Heat or cold intolerance - No Mood changes - No Changes in sleep habits - No Fatigue - No Skin, hair, or nail changes - No Tremor - No Palpitations - No Edema - No Shortness of breath - No  Lab Results  Component Value Date   TSH 4.410 06/17/2022   4. CKD She did establish care with nephrology. She has an appt last month and had labs done a few weeks ago. She has to reschedule her follow up appointment with them.   5. Anxiety/depression Reports doing well on Celexa. Does not desire a change at this time.     06/17/2022   10:02 AM 03/18/2022   10:00 AM 08/29/2021    9:11 AM  Depression screen PHQ 2/9  Decreased Interest 1 0 1  Down, Depressed, Hopeless 1 0 1  PHQ - 2 Score 2 0 2  Altered sleeping _0 Tired, decreased  energy _1 Change in appetite 1 0 1  Feeling bad or failure about yourself  0 0 1  Trouble concentrating 0 0 1  Moving slowly or fidgety/restless 0 0 1  Suicidal thoughts 0 0 0  PHQ-9 Score _2 Difficult doing work/chores Somewhat difficult Not difficult at all Somewhat difficult      06/17/2022   10:02 AM 08/29/2021    9:13 AM 08/21/2021    4:10 PM 06/05/2016    8:21 AM  GAD 7 : Generalized Anxiety Score  Nervous, Anxious, on Edge _3 Control/stop worrying _4 Worry too much - different things _5 Trouble relaxing _6 Restless _7 Easily annoyed or irritable _8 Afraid - awful might happen 0 _9 Total GAD 7 Score _10 Anxiety Difficulty Somewhat difficult Somewhat difficult  Somewhat difficult     ROS As per HPI.    Objective:     BP (!) 148/90   Pulse 90   Temp 97.6 F (36.4 C) (Temporal)   Ht _11  (1.676 m)   Wt 204 lb 2 oz (92.6 kg)   SpO2 95%   BMI 32.95 kg/m  BP Readings from Last 3  Encounters:  09/23/22 (!) 148/90  06/17/22 135/87  03/18/22 (!) 153/93   Wt Readings from Last 3 Encounters:  09/23/22 204 lb 2 oz (92.6 kg)  06/17/22 193 lb 4 oz (87.7 kg)  03/18/22 196 lb 4 oz (89 kg)       Physical Exam Vitals and nursing note reviewed.  Constitutional:      General: She is not in acute distress.    Appearance: She is not ill-appearing, toxic-appearing or diaphoretic.  HENT:     Head: Normocephalic and atraumatic.     Nose: Nose normal.     Mouth/Throat:     Mouth: Mucous membranes are moist.     Pharynx: Oropharynx is clear.  Eyes:     General: No scleral icterus.    Conjunctiva/sclera: Conjunctivae normal.     Pupils: Pupils are equal, round, and reactive to light.  Neck:     Thyroid: No thyroid mass, thyromegaly or thyroid tenderness.  Cardiovascular:     Rate and Rhythm: Normal rate and regular rhythm.     Heart sounds: Normal heart sounds. No murmur heard. Pulmonary:     Effort: Pulmonary  effort is normal. No respiratory distress.     Breath sounds: Normal breath sounds.  Abdominal:     General: Bowel sounds are normal. There is no distension.     Palpations: Abdomen is soft.     Tenderness: There is no abdominal tenderness. There is no guarding or rebound.  Musculoskeletal:     Right lower leg: No edema.     Left lower leg: No edema.  Skin:    General: Skin is warm and dry.  Neurological:     General: No focal deficit present.     Mental Status: She is alert and oriented to person, place, and time.  Psychiatric:        Mood and Affect: Mood normal.        Behavior: Behavior normal.      No results found for any visits on 09/23/22.  Last CBC Lab Results  Component Value Date   WBC 7.7 06/17/2022   HGB 14.1 06/17/2022   HCT 42.5 06/17/2022   MCV 83 06/17/2022   MCH 27.5 06/17/2022   RDW 12.6 06/17/2022   PLT 426 03/50/0938   Last metabolic panel Lab Results  Component Value Date   GLUCOSE 326 (H) 06/17/2022   NA 133 (L) 06/17/2022   K 5.2 06/17/2022   CL 97 06/17/2022   CO2 21 06/17/2022   BUN 17 06/17/2022   CREATININE 0.82 06/17/2022   EGFR 92 06/17/2022   CALCIUM 8.2 (L) 06/17/2022   PROT 6.7 06/17/2022   ALBUMIN 4.2 06/17/2022   LABGLOB 2.5 06/17/2022   AGRATIO 1.7 06/17/2022   BILITOT 0.2 06/17/2022   ALKPHOS 41 (L) 06/17/2022   AST 19 06/17/2022   ALT 19 06/17/2022   Last lipids Lab Results  Component Value Date   CHOL 331 (H) 06/17/2022   HDL 36 (L) 06/17/2022   LDLCALC 210 (H) 06/17/2022   TRIG 403 (H) 06/17/2022   CHOLHDL 9.2 (H) 06/17/2022   Last hemoglobin A1c Lab Results  Component Value Date   HGBA1C 13.7 (H) 06/17/2022      The ASCVD Risk score (Arnett DK, et al., 2019) failed to calculate for the following reasons:   The valid total cholesterol range is 130 to 320 mg/dL    Assessment & Plan:   Felesia was seen today for medical management of chronic issues and  diabetes.  Diagnoses and all orders for this  visit:  Uncontrolled type 2 diabetes mellitus with hyperglycemia ([place]) Reviewed A1c from Dr. Marylene Buerger on 09/02/22 which was 12.2. Uncontrolled, but lower than previous. Discussed increase in tresiba to 14 units daily. Discussed to start check blood sugars regularly. She will check on insurance coverage for long acting insulins. Continue metformin, glimepiride. Follow up with Almyra Free in 4 weeks for medication management. Discussed may need to start meal time coverage insulin. On ARB and statin.   Microalbuminuria due to type 2 diabetes mellitus ([place]) Managed by nephrology. On ARB. Unable to afford farxiga.   Dyslipidemia associated with type 2 diabetes mellitus ([place]) Restarted statin 2 months ago. Will recheck lipid panel today.  -     Lipid panel  Hypertension associated with diabetes ([place]) Not at goal. Patient does not want to adjust medications today and wants to work on lifestyle management. Continue losartan.   Acquired hypothyroidism Has been well controlled with levothyroxine 150 mcg. TSH level pending.  -     TSH  Class 1 obesity due to excess calories with serious comorbidity and body mass index (BMI) of 32.0 to 32.9 in adult Stable. Diet and exercise.   Depression, major, recurrent, moderate (H[facility]) Generalized anxiety disorder Refused PHQ, GAD screening today. Reports well controlled with celexa.  -     citalopram (CELEXA) 20 MG tablet; Take 1 tablet (20 mg total) by mouth daily.  Return in about 4 weeks (around 10/21/2022) for with Almyra Free for DM follow up, 3 months with me.   The patient indicates understanding of these issues and agrees with the plan.  Gwenlyn Perking, FNP

## 2022-09-24 LAB — LIPID PANEL
Chol/HDL Ratio: 6.1 ratio — ABNORMAL HIGH (ref 0.0–4.4)
Cholesterol, Total: 207 mg/dL — ABNORMAL HIGH (ref 100–199)
HDL: 34 mg/dL — ABNORMAL LOW (ref >39)
LDL Chol Calc (NIH): 131 mg/dL — ABNORMAL HIGH (ref 0–99)
Triglycerides: 235 mg/dL — ABNORMAL HIGH (ref 0–149)
VLDL Cholesterol Cal: 42 mg/dL — ABNORMAL HIGH (ref 5–40)

## 2022-09-24 LAB — TSH: TSH: 3.06 u[IU]/mL (ref 0.450–4.500)

## 2022-09-25 ENCOUNTER — Other Ambulatory Visit: Payer: Self-pay | Admitting: Family Medicine

## 2022-09-25 DIAGNOSIS — E1169 Type 2 diabetes mellitus with other specified complication: Secondary | ICD-10-CM

## 2022-09-25 MED ORDER — EZETIMIBE 10 MG PO TABS: 10.0000 mg | ORAL_TABLET | Freq: Every day | ORAL | 3 refills | Status: DC

## 2022-10-01 ENCOUNTER — Telehealth: Payer: Self-pay | Admitting: Family Medicine

## 2022-10-01 DIAGNOSIS — F411 Generalized anxiety disorder: Secondary | ICD-10-CM

## 2022-10-01 DIAGNOSIS — F331 Major depressive disorder, recurrent, moderate: Secondary | ICD-10-CM

## 2022-10-02 MED ORDER — CITALOPRAM HYDROBROMIDE 20 MG PO TABS: 20.0000 mg | ORAL_TABLET | Freq: Every day | ORAL | 3 refills | Status: DC

## 2022-10-02 NOTE — Telephone Encounter (Signed)
Pt states that [facility] is trying to give her the brand name Celexa so she wanted the generic sent to [facility] which I did.

## 2022-10-02 NOTE — Addendum Note (Signed)
Addended by: Hessie Diener on: 10/02/2022 08:37 AM   Modules accepted: Orders

## 2022-10-02 NOTE — Telephone Encounter (Signed)
Generic is what her prescription is for.

## 2022-10-18 ENCOUNTER — Other Ambulatory Visit: Payer: Self-pay | Admitting: Family Medicine

## 2022-10-18 DIAGNOSIS — E1169 Type 2 diabetes mellitus with other specified complication: Secondary | ICD-10-CM

## 2022-10-18 DIAGNOSIS — I152 Hypertension secondary to endocrine disorders: Secondary | ICD-10-CM

## 2022-10-18 DIAGNOSIS — E1165 Type 2 diabetes mellitus with hyperglycemia: Secondary | ICD-10-CM

## 2022-10-18 MED ORDER — EZETIMIBE 10 MG PO TABS: 10.0000 mg | ORAL_TABLET | Freq: Every day | ORAL | 2 refills | Status: DC

## 2022-10-18 NOTE — Telephone Encounter (Signed)
Fax RF for Ezetimibe from [facility], RF on 09/25/22 #90 w/ 3 RFs sent to local [facility], remaining RFs sent to mail order

## 2022-10-18 NOTE — Addendum Note (Signed)
Addended by: Julious Payer D on: 10/18/2022 10:01 AM   Modules accepted: Orders

## 2022-12-19 ENCOUNTER — Encounter: Payer: Self-pay | Admitting: Family Medicine

## 2022-12-19 ENCOUNTER — Ambulatory Visit (INDEPENDENT_AMBULATORY_CARE_PROVIDER_SITE_OTHER): Payer: BC Managed Care – PPO | Admitting: Family Medicine

## 2022-12-19 VITALS — BP 155/92 | HR 94 | Temp 97.8°F | Ht 66.0 in | Wt 203.0 lb

## 2022-12-19 DIAGNOSIS — E1165 Type 2 diabetes mellitus with hyperglycemia: Secondary | ICD-10-CM

## 2022-12-19 DIAGNOSIS — Z23 Encounter for immunization: Secondary | ICD-10-CM | POA: Diagnosis not present

## 2022-12-19 DIAGNOSIS — Z1159 Encounter for screening for other viral diseases: Secondary | ICD-10-CM | POA: Diagnosis not present

## 2022-12-19 DIAGNOSIS — E1159 Type 2 diabetes mellitus with other circulatory complications: Secondary | ICD-10-CM

## 2022-12-19 DIAGNOSIS — E1169 Type 2 diabetes mellitus with other specified complication: Secondary | ICD-10-CM

## 2022-12-19 DIAGNOSIS — E785 Hyperlipidemia, unspecified: Secondary | ICD-10-CM

## 2022-12-19 DIAGNOSIS — E1129 Type 2 diabetes mellitus with other diabetic kidney complication: Secondary | ICD-10-CM

## 2022-12-19 DIAGNOSIS — I152 Hypertension secondary to endocrine disorders: Secondary | ICD-10-CM

## 2022-12-19 DIAGNOSIS — E039 Hypothyroidism, unspecified: Secondary | ICD-10-CM

## 2022-12-19 DIAGNOSIS — R809 Proteinuria, unspecified: Secondary | ICD-10-CM

## 2022-12-19 DIAGNOSIS — F411 Generalized anxiety disorder: Secondary | ICD-10-CM

## 2022-12-19 DIAGNOSIS — F331 Major depressive disorder, recurrent, moderate: Secondary | ICD-10-CM

## 2022-12-19 LAB — BAYER DCA HB A1C WAIVED: HB A1C (BAYER DCA - WAIVED): 14 % — ABNORMAL HIGH (ref 4.8–5.6)

## 2022-12-19 MED ORDER — DAPAGLIFLOZIN PROPANEDIOL 10 MG PO TABS: 10.0000 mg | ORAL_TABLET | Freq: Every day | ORAL | 1 refills | Status: DC

## 2022-12-19 NOTE — Progress Notes (Signed)
Established Patient Office Visit  Subjective   Patient ID: Krista Tate, female    DOB: 07/24/1981  Age: 42 y.o. MRN: [id number]  Chief Complaint  Patient presents with   Medical Management of Chronic Issues   Diabetes    DM Patient denies foot ulcerations, hyperglycemia, nausea, paresthesia of the feet, polydipsia, polyuria, visual disturbances, and vomiting.  Compliant with meds - metformin and glimepiride. She has been tresiba 200u dosage at 14 units at night. She has been using samples of this. Reports that insurance will not cover long acting insulins.   Current monitoring regimen: none Current diet:  regular Current exercise: none  Is She on ACE inhibitor or angiotensin II receptor blocker?  Yes, losartan Is She on statin? Yes restarted in September  2. HTN Complaint with meds - Yes Current Medications - losartan 50 mg She hasn't been checking BP at home. She is feeling stressed today.  Pertinent ROS:  Visual Disturbances - No Chest pain - No Dyspnea - No Palpitations - No LE edema - No  3. Hypothyroid Compliant with medications - Yes Current medications - levothyroxine 150 mcg Weight -  stable Bowel habit changes - No Heat or cold intolerance - No Mood changes - No Changes in sleep habits - No Fatigue - No Skin, hair, or nail changes - No Tremor - No Palpitations - No Edema - No Shortness of breath - No  Lab Results  Component Value Date   TSH 3.060 09/23/2022   4. CKD She did establish care with nephrology. She has not scheduled her follow up.   5. Anxiety/depression Reports doing well on Celexa. Does not desire a change at this time.      12/19/2022   11:36 AM 06/17/2022   10:02 AM 03/18/2022   10:00 AM  Depression screen PHQ 2/9  Decreased Interest 1 1 0  Down, Depressed, Hopeless 1 1 0  PHQ - 2 Score 2 2 0  Altered sleeping 2 2 2  $ Tired, decreased energy 1 1 1  $ Change in appetite 1 1 0  Feeling bad or failure about yourself  1 0 0   Trouble concentrating 1 0 0  Moving slowly or fidgety/restless 0 0 0  Suicidal thoughts 1 0 0  PHQ-9 Score 9 6 3  $ Difficult doing work/chores Somewhat difficult Somewhat difficult Not difficult at all      12/19/2022   11:36 AM 06/17/2022   10:02 AM 08/29/2021    9:13 AM 08/21/2021    4:10 PM  GAD 7 : Generalized Anxiety Score  Nervous, Anxious, on Edge 2 2 1 2  $ Control/stop worrying 2 2 3 2  $ Worry too much - different things 2 1 1 2  $ Trouble relaxing 2 2 2 2  $ Restless 2 1 2 3  $ Easily annoyed or irritable 2 1 1 2  $ Afraid - awful might happen 2 0 1 1  Total GAD 7 Score 14 9 11 14  $ Anxiety Difficulty Somewhat difficult Somewhat difficult Somewhat difficult      ROS As per HPI.    Objective:     BP (!) 155/92   Pulse 94   Temp 97.8 F (36.6 C) (Temporal)   Ht 5' 6"$  (1.676 m)   Wt 203 lb (92.1 kg)   SpO2 95%   BMI 32.77 kg/m  BP Readings from Last 3 Encounters:  12/19/22 (!) 155/92  09/23/22 (!) 148/90  06/17/22 135/87   Wt Readings from Last 3 Encounters:  12/19/22 203  lb (92.1 kg)  09/23/22 204 lb 2 oz (92.6 kg)  06/17/22 193 lb 4 oz (87.7 kg)       Physical Exam Vitals and nursing note reviewed.  Constitutional:      General: She is not in acute distress.    Appearance: She is not ill-appearing, toxic-appearing or diaphoretic.  HENT:     Head: Normocephalic and atraumatic.     Nose: Nose normal.     Mouth/Throat:     Mouth: Mucous membranes are moist.     Pharynx: Oropharynx is clear.  Eyes:     General: No scleral icterus.    Conjunctiva/sclera: Conjunctivae normal.     Pupils: Pupils are equal, round, and reactive to light.  Neck:     Thyroid: No thyroid mass, thyromegaly or thyroid tenderness.  Cardiovascular:     Rate and Rhythm: Normal rate and regular rhythm.     Heart sounds: Normal heart sounds. No murmur heard. Pulmonary:     Effort: Pulmonary effort is normal. No respiratory distress.     Breath sounds: Normal breath sounds.   Abdominal:     General: Bowel sounds are normal. There is no distension.     Palpations: Abdomen is soft.     Tenderness: There is no abdominal tenderness. There is no guarding or rebound.  Musculoskeletal:     Right lower leg: No edema.     Left lower leg: No edema.  Skin:    General: Skin is warm and dry.  Neurological:     General: No focal deficit present.     Mental Status: She is alert and oriented to person, place, and time.  Psychiatric:        Mood and Affect: Mood normal.        Behavior: Behavior normal.      No results found for any visits on 12/19/22.  Last CBC Lab Results  Component Value Date   WBC 7.7 06/17/2022   HGB 14.1 06/17/2022   HCT 42.5 06/17/2022   MCV 83 06/17/2022   MCH 27.5 06/17/2022   RDW 12.6 06/17/2022   PLT 426 0000000   Last metabolic panel Lab Results  Component Value Date   GLUCOSE 326 (H) 06/17/2022   NA 133 (L) 06/17/2022   K 5.2 06/17/2022   CL 97 06/17/2022   CO2 21 06/17/2022   BUN 17 06/17/2022   CREATININE 0.82 06/17/2022   EGFR 92 06/17/2022   CALCIUM 8.2 (L) 06/17/2022   PROT 6.7 06/17/2022   ALBUMIN 4.2 06/17/2022   LABGLOB 2.5 06/17/2022   AGRATIO 1.7 06/17/2022   BILITOT 0.2 06/17/2022   ALKPHOS 41 (L) 06/17/2022   AST 19 06/17/2022   ALT 19 06/17/2022   Last lipids Lab Results  Component Value Date   CHOL 207 (H) 09/23/2022   HDL 34 (L) 09/23/2022   LDLCALC 131 (H) 09/23/2022   TRIG 235 (H) 09/23/2022   CHOLHDL 6.1 (H) 09/23/2022   Last hemoglobin A1c Lab Results  Component Value Date   HGBA1C 13.7 (H) 06/17/2022      The 10-year ASCVD risk score (Arnett DK, et al., 2019) is: 6.2%    Assessment & Plan:   Krista Tate was seen today for medical management of chronic issues and diabetes.  Diagnoses and all orders for this visit:  Uncontrolled type 2 diabetes mellitus with hyperglycemia ([place]) Uncontrolled with A1c >14. Will try to add farxiga as there is now a generic option. Increase tresiba  to 18 units qhs.  Continue glimepiride and metformin. Discussed compliance with diet and exercise. Referral to endo discussed and placed. On ARB and statin. Foot exam today. Urine micro and eye exam are UTD.  -     Bayer DCA Hb A1c Waived -     Lipid panel -     CMP14+EGFR -     dapagliflozin propanediol (FARXIGA) 10 MG TABS tablet; Take 1 tablet (10 mg total) by mouth daily. -     Ambulatory referral to Endocrinology  Hypertension associated with diabetes ([place]) BP not at goal today. Reports elevated due to stress. Discussed compliance with medications, diet, exercise. She will start monitoring BP at home and notify for elevated readings. Continue losartan.  -     CMP14+EGFR  Dyslipidemia associated with type 2 diabetes mellitus ([place]) On atorvastatin. Zetia was added at last visit. Repeat lipid panel pending.  -     Lipid panel -     CMP14+EGFR  Microalbuminuria due to type 2 diabetes mellitus ([place]) Start farxiga. On ARB.  -     dapagliflozin propanediol (FARXIGA) 10 MG TABS tablet; Take 1 tablet (10 mg total) by mouth daily.  Acquired hypothyroidism Well controlled on current regimen. Continue levothyroxine.  -     TSH  Depression, major, recurrent, moderate (HCC) Generalized anxiety disorder Well controlled on current regimen. Continue celexa.   Need for hepatitis C screening test -     Hepatitis C antibody  Need for vaccination -     Tdap vaccine greater than or equal to 7yo IM  Return in about 3 months (around 03/19/2023) for chronic follow up.   The patient indicates understanding of these issues and agrees with the plan.  Gwenlyn Perking, FNP

## 2022-12-19 NOTE — Addendum Note (Signed)
Addended by: Gwenlyn Perking on: 12/19/2022 04:07 PM   Modules accepted: Level of Service

## 2022-12-20 LAB — HEPATITIS C ANTIBODY: Hep C Virus Ab: NONREACTIVE

## 2022-12-20 LAB — CMP14+EGFR
ALT: 26 IU/L (ref 0–32)
AST: 16 IU/L (ref 0–40)
Albumin/Globulin Ratio: 1.8 (ref 1.2–2.2)
Albumin: 4.1 g/dL (ref 3.9–4.9)
Alkaline Phosphatase: 38 IU/L — ABNORMAL LOW (ref 44–121)
BUN/Creatinine Ratio: 24 — ABNORMAL HIGH (ref 9–23)
BUN: 22 mg/dL (ref 6–24)
Bilirubin Total: <0.2 mg/dL (ref 0.0–1.2)
CO2: 21 mmol/L (ref 20–29)
Calcium: 9.4 mg/dL (ref 8.7–10.2)
Chloride: 97 mmol/L (ref 96–106)
Creatinine, Ser: 0.92 mg/dL (ref 0.57–1.00)
Globulin, Total: 2.3 g/dL (ref 1.5–4.5)
Glucose: 364 mg/dL — ABNORMAL HIGH (ref 70–99)
Potassium: 5 mmol/L (ref 3.5–5.2)
Sodium: 135 mmol/L (ref 134–144)
Total Protein: 6.4 g/dL (ref 6.0–8.5)
eGFR: 80 mL/min/{1.73_m2} (ref >59)

## 2022-12-20 LAB — LIPID PANEL
Chol/HDL Ratio: 4.3 ratio (ref 0.0–4.4)
Cholesterol, Total: 145 mg/dL (ref 100–199)
HDL: 34 mg/dL — ABNORMAL LOW (ref >39)
LDL Chol Calc (NIH): 73 mg/dL (ref 0–99)
Triglycerides: 230 mg/dL — ABNORMAL HIGH (ref 0–149)
VLDL Cholesterol Cal: 38 mg/dL (ref 5–40)

## 2022-12-20 LAB — TSH: TSH: 3.62 u[IU]/mL (ref 0.450–4.500)

## 2023-01-03 ENCOUNTER — Other Ambulatory Visit: Payer: Self-pay | Admitting: Family Medicine

## 2023-01-03 DIAGNOSIS — E1165 Type 2 diabetes mellitus with hyperglycemia: Secondary | ICD-10-CM

## 2023-01-03 DIAGNOSIS — E1159 Type 2 diabetes mellitus with other circulatory complications: Secondary | ICD-10-CM

## 2023-01-03 DIAGNOSIS — E1169 Type 2 diabetes mellitus with other specified complication: Secondary | ICD-10-CM

## 2023-01-12 ENCOUNTER — Other Ambulatory Visit: Payer: Self-pay | Admitting: Family Medicine

## 2023-04-01 ENCOUNTER — Other Ambulatory Visit: Payer: Self-pay | Admitting: Family Medicine

## 2023-04-01 DIAGNOSIS — E1165 Type 2 diabetes mellitus with hyperglycemia: Secondary | ICD-10-CM

## 2023-04-17 ENCOUNTER — Ambulatory Visit: Payer: BC Managed Care – PPO | Admitting: Family Medicine

## 2023-04-17 ENCOUNTER — Encounter: Payer: Self-pay | Admitting: Family Medicine

## 2023-04-17 VITALS — BP 149/90 | HR 96 | Temp 98.0°F | Ht 66.0 in | Wt 199.0 lb

## 2023-04-17 DIAGNOSIS — F331 Major depressive disorder, recurrent, moderate: Secondary | ICD-10-CM

## 2023-04-17 DIAGNOSIS — I152 Hypertension secondary to endocrine disorders: Secondary | ICD-10-CM

## 2023-04-17 DIAGNOSIS — E785 Hyperlipidemia, unspecified: Secondary | ICD-10-CM

## 2023-04-17 DIAGNOSIS — Z7984 Long term (current) use of oral hypoglycemic drugs: Secondary | ICD-10-CM | POA: Diagnosis not present

## 2023-04-17 DIAGNOSIS — E1165 Type 2 diabetes mellitus with hyperglycemia: Secondary | ICD-10-CM | POA: Diagnosis not present

## 2023-04-17 DIAGNOSIS — E1159 Type 2 diabetes mellitus with other circulatory complications: Secondary | ICD-10-CM

## 2023-04-17 DIAGNOSIS — E039 Hypothyroidism, unspecified: Secondary | ICD-10-CM

## 2023-04-17 DIAGNOSIS — R809 Proteinuria, unspecified: Secondary | ICD-10-CM

## 2023-04-17 DIAGNOSIS — E1129 Type 2 diabetes mellitus with other diabetic kidney complication: Secondary | ICD-10-CM

## 2023-04-17 DIAGNOSIS — E1169 Type 2 diabetes mellitus with other specified complication: Secondary | ICD-10-CM

## 2023-04-17 DIAGNOSIS — F411 Generalized anxiety disorder: Secondary | ICD-10-CM

## 2023-04-17 LAB — BAYER DCA HB A1C WAIVED: HB A1C (BAYER DCA - WAIVED): 14 % — ABNORMAL HIGH (ref 4.8–5.6)

## 2023-04-17 MED ORDER — EMPAGLIFLOZIN 25 MG PO TABS: 25.0000 mg | ORAL_TABLET | Freq: Every day | ORAL | 3 refills | Status: DC

## 2023-04-17 NOTE — Progress Notes (Signed)
Established Patient Office Visit  Subjective   Patient ID: Krista Tate, female    DOB: 1981-06-14  Age: 42 y.o. MRN: [id number]  Chief Complaint  Patient presents with   Follow-up    F/u meds    DM Patient denies foot ulcerations, hyperglycemia, nausea, paresthesia of the feet, polydipsia, polyuria, visual disturbances, and vomiting.  Compliant with meds - metformin and glimepiride.  She is unable to afford other medications or insulin due to high deductible plan.   Current monitoring regimen: none Current diet:  regular Current exercise: none  2. HTN Complaint with meds - Yes Current Medications - losartan 50 mg She hasn't been checking BP at home. She is feeling stressed today due to an increased copay cost that she wasn't expecting Pertinent ROS:  Visual Disturbances - No Chest pain - No Dyspnea - No Palpitations - No LE edema - No  3. Hypothyroid Compliant with medications - Yes Current medications - levothyroxine 150 mcg Weight -  stable Bowel habit changes - No Heat or cold intolerance - No Mood changes - No Changes in sleep habits - No Fatigue - No Skin, hair, or nail changes - No Tremor - No Palpitations - No Edema - No Shortness of breath - No  Lab Results  Component Value Date   TSH 3.620 12/19/2022    4. Anxiety/depression Reports doing well on Celexa. Has increased stress due to work. Does not desire a change at this time.      12/19/2022   11:36 AM 06/17/2022   10:02 AM 03/18/2022   10:00 AM  Depression screen PHQ 2/9  Decreased Interest 1 1 0  Down, Depressed, Hopeless 1 1 0  PHQ - 2 Score 2 2 0  Altered sleeping 2 2 2   Tired, decreased energy 1 1 1   Change in appetite 1 1 0  Feeling bad or failure about yourself  1 0 0  Trouble concentrating 1 0 0  Moving slowly or fidgety/restless 0 0 0  Suicidal thoughts 1 0 0  PHQ-9 Score 9 6 3   Difficult doing work/chores Somewhat difficult Somewhat difficult Not difficult at all       12/19/2022   11:36 AM 06/17/2022   10:02 AM 08/29/2021    9:13 AM 08/21/2021    4:10 PM  GAD 7 : Generalized Anxiety Score  Nervous, Anxious, on Edge 2 2 1 2   Control/stop worrying 2 2 3 2   Worry too much - different things 2 1 1 2   Trouble relaxing 2 2 2 2   Restless 2 1 2 3   Easily annoyed or irritable 2 1 1 2   Afraid - awful might happen 2 0 1 1  Total GAD 7 Score 14 9 11 14   Anxiety Difficulty Somewhat difficult Somewhat difficult Somewhat difficult      ROS As per HPI.    Objective:     BP (!) 149/90   Pulse 96   Temp 98 F (36.7 C) (Oral)   Ht 5\' 6"  (1.676 m)   Wt 199 lb (90.3 kg)   LMP  (Approximate)   SpO2 94%   BMI 32.12 kg/m  BP Readings from Last 3 Encounters:  04/17/23 (!) 149/90  12/19/22 (!) 155/92  09/23/22 (!) 148/90   Wt Readings from Last 3 Encounters:  04/17/23 199 lb (90.3 kg)  12/19/22 203 lb (92.1 kg)  09/23/22 204 lb 2 oz (92.6 kg)     Physical Exam Vitals and nursing note reviewed.  Constitutional:  General: She is not in acute distress.    Appearance: She is not ill-appearing, toxic-appearing or diaphoretic.  Eyes:     General: No scleral icterus. Neck:     Thyroid: No thyroid mass, thyromegaly or thyroid tenderness.  Cardiovascular:     Rate and Rhythm: Normal rate and regular rhythm.     Heart sounds: Normal heart sounds. No murmur heard. Pulmonary:     Effort: Pulmonary effort is normal. No respiratory distress.     Breath sounds: Normal breath sounds.  Abdominal:     General: Bowel sounds are normal. There is no distension.     Palpations: Abdomen is soft.     Tenderness: There is no abdominal tenderness. There is no guarding or rebound.  Musculoskeletal:     Cervical back: Normal range of motion. No rigidity.     Right lower leg: No edema.     Left lower leg: No edema.  Skin:    General: Skin is warm and dry.  Neurological:     General: No focal deficit present.     Mental Status: She is alert and oriented to  person, place, and time.  Psychiatric:        Mood and Affect: Mood normal.        Behavior: Behavior normal.      No results found for any visits on 04/17/23.  Last CBC Lab Results  Component Value Date   WBC 7.7 06/17/2022   HGB 14.1 06/17/2022   HCT 42.5 06/17/2022   MCV 83 06/17/2022   MCH 27.5 06/17/2022   RDW 12.6 06/17/2022   PLT 426 06/17/2022   Last metabolic panel Lab Results  Component Value Date   GLUCOSE 364 (H) 12/19/2022   NA 135 12/19/2022   K 5.0 12/19/2022   CL 97 12/19/2022   CO2 21 12/19/2022   BUN 22 12/19/2022   CREATININE 0.92 12/19/2022   EGFR 80 12/19/2022   CALCIUM 9.4 12/19/2022   PROT 6.4 12/19/2022   ALBUMIN 4.1 12/19/2022   LABGLOB 2.3 12/19/2022   AGRATIO 1.8 12/19/2022   BILITOT <0.2 12/19/2022   ALKPHOS 38 (L) 12/19/2022   AST 16 12/19/2022   ALT 26 12/19/2022   Last lipids Lab Results  Component Value Date   CHOL 145 12/19/2022   HDL 34 (L) 12/19/2022   LDLCALC 73 12/19/2022   TRIG 230 (H) 12/19/2022   CHOLHDL 4.3 12/19/2022   Last hemoglobin A1c Lab Results  Component Value Date   HGBA1C 14.0 (H) 12/19/2022      The 10-year ASCVD risk score (Arnett DK, et al., 2019) is: 3%    Assessment & Plan:   Kaily was seen today for follow-up.  Diagnoses and all orders for this visit:  Uncontrolled type 2 diabetes mellitus with hyperglycemia (HCC) A1c >14 today, not at goal of <7. Medication changes today: start jardiance. Samples given. She likely will not be able to afford jardiance due to cost with HD plan. Will route to Raynelle Fanning to see if patient would qualify for assistance with insulin. She is on an ACE/ARB and statin. Eye exam: reminded to schedule. Foot exam: UTD. Urine micro: UTD. Diet and exercise.  -     BMP8+EGFR -     Bayer DCA Hb A1c Waived -     empagliflozin (JARDIANCE) 25 MG TABS tablet; Take 1 tablet (25 mg total) by mouth daily before breakfast.  Long term current use of oral hypoglycemic  drug  Hypertension associated with diabetes (HCC)  BP slightly elevated. Discussed compliance with diet and exercise. Monitor at home and notify for elevated readings.   Dyslipidemia associated with type 2 diabetes mellitus (HCC) Last LDL at goal.   Microalbuminuria due to type 2 diabetes mellitus [facility]) Established with nephrology. On ARB. Start jardiance.   Acquired hypothyroidism Well controlled on current regimen.   Depression, major, recurrent, moderate (HCC) Generalized anxiety disorder Stable, denies SI. Continue celexa.    Return in about 3 months (around 07/18/2023).   The patient indicates understanding of these issues and agrees with the plan.  Gabriel Earing, FNP

## 2023-04-18 ENCOUNTER — Telehealth: Payer: Self-pay | Admitting: Family Medicine

## 2023-04-18 LAB — BMP8+EGFR
BUN/Creatinine Ratio: 21 (ref 9–23)
BUN: 21 mg/dL (ref 6–24)
CO2: 21 mmol/L (ref 20–29)
Calcium: 9.8 mg/dL (ref 8.7–10.2)
Chloride: 95 mmol/L — ABNORMAL LOW (ref 96–106)
Creatinine, Ser: 0.99 mg/dL (ref 0.57–1.00)
Glucose: 548 mg/dL (ref 70–99)
Potassium: 5.2 mmol/L (ref 3.5–5.2)
Sodium: 134 mmol/L (ref 134–144)
eGFR: 73 mL/min/{1.73_m2} (ref >59)

## 2023-04-18 NOTE — Telephone Encounter (Signed)
Addressed in result note.  

## 2023-04-18 NOTE — Telephone Encounter (Signed)
[facility] called with critical lab result.  Glucose 548 Prolonged exposure of serum 2TO cells

## 2023-05-22 ENCOUNTER — Telehealth: Payer: BC Managed Care – PPO | Admitting: Pharmacist

## 2023-06-30 ENCOUNTER — Other Ambulatory Visit: Payer: Self-pay | Admitting: Family Medicine

## 2023-06-30 DIAGNOSIS — E1165 Type 2 diabetes mellitus with hyperglycemia: Secondary | ICD-10-CM

## 2023-06-30 DIAGNOSIS — E039 Hypothyroidism, unspecified: Secondary | ICD-10-CM

## 2023-07-14 ENCOUNTER — Ambulatory Visit: Payer: BC Managed Care – PPO | Admitting: Family Medicine

## 2023-07-18 ENCOUNTER — Other Ambulatory Visit: Payer: Self-pay | Admitting: Family Medicine

## 2023-07-18 DIAGNOSIS — E1159 Type 2 diabetes mellitus with other circulatory complications: Secondary | ICD-10-CM

## 2023-07-18 DIAGNOSIS — E1169 Type 2 diabetes mellitus with other specified complication: Secondary | ICD-10-CM

## 2023-07-21 ENCOUNTER — Telehealth: Payer: Self-pay | Admitting: Family Medicine

## 2023-07-30 ENCOUNTER — Ambulatory Visit: Payer: BC Managed Care – PPO | Admitting: Family Medicine

## 2023-08-08 ENCOUNTER — Other Ambulatory Visit: Payer: Self-pay | Admitting: Family Medicine

## 2023-08-08 ENCOUNTER — Ambulatory Visit: Payer: BC Managed Care – PPO | Admitting: Family Medicine

## 2023-08-08 ENCOUNTER — Encounter: Payer: Self-pay | Admitting: Family Medicine

## 2023-08-08 VITALS — BP 152/92 | HR 92 | Temp 98.0°F | Ht 66.0 in | Wt 197.1 lb

## 2023-08-08 DIAGNOSIS — F411 Generalized anxiety disorder: Secondary | ICD-10-CM

## 2023-08-08 DIAGNOSIS — Z7984 Long term (current) use of oral hypoglycemic drugs: Secondary | ICD-10-CM | POA: Diagnosis not present

## 2023-08-08 DIAGNOSIS — E039 Hypothyroidism, unspecified: Secondary | ICD-10-CM

## 2023-08-08 DIAGNOSIS — Z794 Long term (current) use of insulin: Secondary | ICD-10-CM | POA: Diagnosis not present

## 2023-08-08 DIAGNOSIS — E1169 Type 2 diabetes mellitus with other specified complication: Secondary | ICD-10-CM

## 2023-08-08 DIAGNOSIS — E1165 Type 2 diabetes mellitus with hyperglycemia: Secondary | ICD-10-CM | POA: Diagnosis not present

## 2023-08-08 DIAGNOSIS — E6609 Other obesity due to excess calories: Secondary | ICD-10-CM

## 2023-08-08 DIAGNOSIS — E1159 Type 2 diabetes mellitus with other circulatory complications: Secondary | ICD-10-CM

## 2023-08-08 DIAGNOSIS — I152 Hypertension secondary to endocrine disorders: Secondary | ICD-10-CM

## 2023-08-08 DIAGNOSIS — E1129 Type 2 diabetes mellitus with other diabetic kidney complication: Secondary | ICD-10-CM

## 2023-08-08 DIAGNOSIS — E785 Hyperlipidemia, unspecified: Secondary | ICD-10-CM

## 2023-08-08 DIAGNOSIS — F331 Major depressive disorder, recurrent, moderate: Secondary | ICD-10-CM

## 2023-08-08 DIAGNOSIS — R809 Proteinuria, unspecified: Secondary | ICD-10-CM

## 2023-08-08 LAB — BAYER DCA HB A1C WAIVED: HB A1C (BAYER DCA - WAIVED): 14 % — ABNORMAL HIGH (ref 4.8–5.6)

## 2023-08-08 MED ORDER — TRESIBA FLEXTOUCH 100 UNIT/ML ~~LOC~~ SOPN: 18.0000 [IU] | PEN_INJECTOR | Freq: Every day | SUBCUTANEOUS | Status: DC

## 2023-08-08 MED ORDER — LOSARTAN POTASSIUM 100 MG PO TABS
100.0000 mg | ORAL_TABLET | Freq: Every day | ORAL | 3 refills | Status: DC
Start: 2023-08-08 — End: 2023-08-11

## 2023-08-08 NOTE — Progress Notes (Signed)
Established Patient Office Visit  Subjective   Patient ID: Krista Tate, female    DOB: 06-22-1981  Age: 42 y.o. MRN: [id number]  Chief Complaint  Patient presents with   Medical Management of Chronic Issues   Diabetes   Hypertension   Hypothyroidism    DM Patient denies foot ulcerations, hyperglycemia, nausea, paresthesia of the feet, polydipsia, polyuria, visual disturbances, and vomiting. Feels fatigued.   Compliant with meds - metformin and glimepiride. Tried jardiance- did well but unable to afford. Reports that she can only afford metformin and glimepiride due to HD plan. States all other DM meds and insulin are $400+ a month.   Current monitoring regimen: none Current diet:  regular Current exercise: none  2. HTN Complaint with meds - Yes Current Medications - losartan 50 mg She hasn't been checking BP at home.  Pertinent ROS:  Visual Disturbances - No Chest pain - No Dyspnea - No Palpitations - No LE edema - No  3. Hypothyroid Compliant with medications - Yes Current medications - levothyroxine 150 mcg Weight -  stable Bowel habit changes - No Heat or cold intolerance - No Mood changes - No Changes in sleep habits - No Fatigue - No Skin, hair, or nail changes - No Tremor - No Palpitations - No Edema - No Shortness of breath - No  4. Anxiety/depression On celexa. Reports stress with work. Declines changes in regimen.      08/08/2023    8:52 AM 12/19/2022   11:36 AM 06/17/2022   10:02 AM  Depression screen PHQ 2/9  Decreased Interest 1 1 1   Down, Depressed, Hopeless 1 1 1   PHQ - 2 Score 2 2 2   Altered sleeping 1 2 2   Tired, decreased energy 1 1 1   Change in appetite 1 1 1   Feeling bad or failure about yourself  1 1 0  Trouble concentrating 1 1 0  Moving slowly or fidgety/restless 0 0 0  Suicidal thoughts 1 1 0  PHQ-9 Score 8 9 6   Difficult doing work/chores Somewhat difficult Somewhat difficult Somewhat difficult      08/08/2023    8:51 AM  12/19/2022   11:36 AM 06/17/2022   10:02 AM 08/29/2021    9:13 AM  GAD 7 : Generalized Anxiety Score  Nervous, Anxious, on Edge 2 2 2 1   Control/stop worrying 2 2 2 3   Worry too much - different things 2 2 1 1   Trouble relaxing 2 2 2 2   Restless 2 2 1 2   Easily annoyed or irritable 2 2 1 1   Afraid - awful might happen 2 2 0 1  Total GAD 7 Score 14 14 9 11   Anxiety Difficulty Somewhat difficult Somewhat difficult Somewhat difficult Somewhat difficult     ROS As per HPI.    Objective:     BP (!) 152/92   Pulse 92   Temp 98 F (36.7 C) (Temporal)   Ht 5\' 6"  (1.676 m)   Wt 197 lb 2 oz (89.4 kg)   SpO2 97%   BMI 31.82 kg/m  BP Readings from Last 3 Encounters:  08/08/23 (!) 152/92  04/17/23 (!) 149/90  12/19/22 (!) 155/92   Wt Readings from Last 3 Encounters:  08/08/23 197 lb 2 oz (89.4 kg)  04/17/23 199 lb (90.3 kg)  12/19/22 203 lb (92.1 kg)     Physical Exam Vitals and nursing note reviewed.  Constitutional:      General: She is not in acute distress.  Appearance: She is not ill-appearing, toxic-appearing or diaphoretic.  Eyes:     General: No scleral icterus. Neck:     Thyroid: No thyroid mass, thyromegaly or thyroid tenderness.  Cardiovascular:     Rate and Rhythm: Normal rate and regular rhythm.     Heart sounds: Normal heart sounds. No murmur heard. Pulmonary:     Effort: Pulmonary effort is normal. No respiratory distress.     Breath sounds: Normal breath sounds.  Abdominal:     General: Bowel sounds are normal. There is no distension.     Palpations: Abdomen is soft.     Tenderness: There is no abdominal tenderness. There is no guarding or rebound.  Musculoskeletal:     Cervical back: Normal range of motion. No rigidity.     Right lower leg: No edema.     Left lower leg: No edema.  Skin:    General: Skin is warm and dry.  Neurological:     General: No focal deficit present.     Mental Status: She is alert and oriented to person, place, and  time.  Psychiatric:        Mood and Affect: Mood normal.        Behavior: Behavior normal.      No results found for any visits on 08/08/23.  Last CBC Lab Results  Component Value Date   WBC 7.7 06/17/2022   HGB 14.1 06/17/2022   HCT 42.5 06/17/2022   MCV 83 06/17/2022   MCH 27.5 06/17/2022   RDW 12.6 06/17/2022   PLT 426 06/17/2022   Last metabolic panel Lab Results  Component Value Date   GLUCOSE 548 (HH) 04/17/2023   NA 134 04/17/2023   K 5.2 04/17/2023   CL 95 (L) 04/17/2023   CO2 21 04/17/2023   BUN 21 04/17/2023   CREATININE 0.99 04/17/2023   EGFR 73 04/17/2023   CALCIUM 9.8 04/17/2023   PROT 6.4 12/19/2022   ALBUMIN 4.1 12/19/2022   LABGLOB 2.3 12/19/2022   AGRATIO 1.8 12/19/2022   BILITOT <0.2 12/19/2022   ALKPHOS 38 (L) 12/19/2022   AST 16 12/19/2022   ALT 26 12/19/2022   Last lipids Lab Results  Component Value Date   CHOL 145 12/19/2022   HDL 34 (L) 12/19/2022   LDLCALC 73 12/19/2022   TRIG 230 (H) 12/19/2022   CHOLHDL 4.3 12/19/2022   Last hemoglobin A1c Lab Results  Component Value Date   HGBA1C 14.0 (H) 04/17/2023      The 10-year ASCVD risk score (Arnett DK, et al., 2019) is: 3.3%    Assessment & Plan:   Krista Tate was seen today for medical management of chronic issues, diabetes, hypertension and hypothyroidism.  Diagnoses and all orders for this visit:  Uncontrolled type 2 diabetes mellitus with hyperglycemia (HCC) A1c >14 today, not at goal of <7. Medication changes today: samples of tresiba given today. Discussed risk on uncontrolled DM. Referral to [facility] for patient assistance. Per patient all DM medications including insurance cost $400 a month outside of metformin or glimepiride. She is on an ACE/ARB and statin. Eye exam: reminded to schedule. Foot exam: UTD. Urine micro: UTD. Diet and exercise.  -     Bayer DCA Hb A1c Waived -     Vitamin B12 -     insulin degludec (TRESIBA FLEXTOUCH) 100 UNIT/ML FlexTouch Pen; Inject 18 Units  into the skin at bedtime. -     AMB Referral to Pharmacy Medication Management  Long term current use of  oral hypoglycemic drug  Long term current use of insulin (HCC)  Hypertension associated with diabetes (HCC) Not at goal. Increase losartan to 100 mg daily. Monitor at home and notify for elevated readings.  -     CBC with Differential/Platelet -     CMP14+EGFR -     losartan (COZAAR) 100 MG tablet; Take 1 tablet (100 mg total) by mouth daily. -     AMB Referral to Pharmacy Medication Management  Dyslipidemia associated with type 2 diabetes mellitus (HCC) Last LDL 73. On statin.  -     AMB Referral to Pharmacy Medication Management  Microalbuminuria due to type 2 diabetes mellitus (HCC) On ARB. Unable to afford SLGT2. -     Microalbumin / creatinine urine ratio -     AMB Referral to Pharmacy Medication Management  Acquired hypothyroidism Labs pending.  -     TSH -     T4, Free  Depression, major, recurrent, moderate (HCC) Generalized anxiety disorder Stable. Denies SI. Continue celexa.   Class 1 obesity due to excess calories with serious comorbidity and body mass index (BMI) of 32.0 to 32.9 in adult Diet, exercise.      No follow-ups on file.   The patient indicates understanding of these issues and agrees with the plan.  Gabriel Earing, FNP

## 2023-08-09 LAB — CBC WITH DIFFERENTIAL/PLATELET
Basophils Absolute: 0.1 10*3/uL (ref 0.0–0.2)
Basos: 1 %
EOS (ABSOLUTE): 0.5 10*3/uL — ABNORMAL HIGH (ref 0.0–0.4)
Eos: 7 %
Hematocrit: 42.8 % (ref 34.0–46.6)
Hemoglobin: 13.1 g/dL (ref 11.1–15.9)
Immature Grans (Abs): 0 10*3/uL (ref 0.0–0.1)
Immature Granulocytes: 0 %
Lymphocytes Absolute: 2.6 10*3/uL (ref 0.7–3.1)
Lymphs: 33 %
MCH: 26.1 pg — ABNORMAL LOW (ref 26.6–33.0)
MCHC: 30.6 g/dL — ABNORMAL LOW (ref 31.5–35.7)
MCV: 85 fL (ref 79–97)
Monocytes Absolute: 0.6 10*3/uL (ref 0.1–0.9)
Monocytes: 8 %
Neutrophils Absolute: 4.1 10*3/uL (ref 1.4–7.0)
Neutrophils: 51 %
Platelets: 385 10*3/uL (ref 150–450)
RBC: 5.01 x10E6/uL (ref 3.77–5.28)
RDW: 13.1 % (ref 11.7–15.4)
WBC: 7.9 10*3/uL (ref 3.4–10.8)

## 2023-08-09 LAB — CMP14+EGFR
ALT: 23 [IU]/L (ref 0–32)
AST: 21 [IU]/L (ref 0–40)
Albumin: 4.1 g/dL (ref 3.9–4.9)
Alkaline Phosphatase: 48 [IU]/L (ref 44–121)
BUN/Creatinine Ratio: 16 (ref 9–23)
BUN: 16 mg/dL (ref 6–24)
Bilirubin Total: <0.2 mg/dL (ref 0.0–1.2)
CO2: 22 mmol/L (ref 20–29)
Calcium: 9.5 mg/dL (ref 8.7–10.2)
Chloride: 96 mmol/L (ref 96–106)
Creatinine, Ser: 0.97 mg/dL (ref 0.57–1.00)
Globulin, Total: 2.6 g/dL (ref 1.5–4.5)
Glucose: 390 mg/dL — ABNORMAL HIGH (ref 70–99)
Potassium: 4.6 mmol/L (ref 3.5–5.2)
Sodium: 134 mmol/L (ref 134–144)
Total Protein: 6.7 g/dL (ref 6.0–8.5)
eGFR: 74 mL/min/{1.73_m2} (ref >59)

## 2023-08-09 LAB — T4, FREE: Free T4: 1.93 ng/dL — ABNORMAL HIGH (ref 0.82–1.77)

## 2023-08-09 LAB — MICROALBUMIN / CREATININE URINE RATIO
Creatinine, Urine: 30.8 mg/dL
Microalb/Creat Ratio: 1512 mg/g{creat} — ABNORMAL HIGH (ref 0–29)
Microalbumin, Urine: 465.8 ug/mL

## 2023-08-09 LAB — TSH: TSH: 3.75 u[IU]/mL (ref 0.450–4.500)

## 2023-08-09 LAB — VITAMIN B12: Vitamin B-12: 571 pg/mL (ref 232–1245)

## 2023-08-11 NOTE — Telephone Encounter (Signed)
COZAAR 100 MG tablet    Pharmacy comment: Duplicate therapy with Losartan 50mg . Please advise. Please respond with appropriate changes and comment to Pharmacy.

## 2023-08-12 ENCOUNTER — Telehealth: Payer: Self-pay

## 2023-08-12 NOTE — Progress Notes (Signed)
Care Guide Note  08/12/2023 Name: Krista Tate MRN: [id number] DOB: 1980-12-23  Referred by: Gabriel Earing, FNP Reason for referral : Care Coordination (Outreach to schedule with Pharm d )   Krista Tate is a 42 y.o. year old female who is a primary care patient of Gabriel Earing, FNP. Krista Tate was referred to the pharmacist for assistance related to HTN, HLD, and DM.    An unsuccessful telephone outreach was attempted today to contact the patient who was referred to the pharmacy team for assistance with medication management. Additional attempts will be made to contact the patient.   Krista Tate, RMA Care Guide [facility]  [place], [place] [address] Direct Dial: [phone] [email or web address]

## 2023-08-13 NOTE — Progress Notes (Signed)
Care Guide Note  08/13/2023 Name: Krista Tate MRN: [id number] DOB: 03/01/1981  Referred by: Gabriel Earing, FNP Reason for referral : Care Coordination (Outreach to schedule with Pharm d )   Krista Tate is a 42 y.o. year old female who is a primary care patient of Gabriel Earing, FNP. Krista Tate was referred to the pharmacist for assistance related to HTN, HLD, and DM.    A second unsuccessful telephone outreach was attempted today to contact the patient who was referred to the pharmacy team for assistance with medication management. Additional attempts will be made to contact the patient.  Penne Lash, RMA Care Guide [facility]  [place], [place] [address] Direct Dial: [phone] [email or web address]

## 2023-08-15 NOTE — Progress Notes (Signed)
Care Guide Note  08/15/2023 Name: Krista Tate MRN: [id number] DOB: [birth year]  Referred by: Gabriel Earing, FNP Reason for referral : Care Coordination (Outreach to schedule with Pharm d )   Krista Tate is a 42 y.o. year old female who is a primary care patient of Gabriel Earing, FNP. Krista Tate was referred to the pharmacist for assistance related to HTN, HLD, and DM.    A third unsuccessful telephone outreach was attempted today to contact the patient who was referred to the pharmacy team for assistance with medication management. The Population Health team is pleased to engage with this patient at any time in the future upon receipt of referral and should he/she be interested in assistance from the [facility] team.   Penne Lash, RMA Care Guide [facility]  [place], [place] [address] Direct Dial: [phone] [email or web address]

## 2023-09-10 ENCOUNTER — Telehealth: Payer: Self-pay | Admitting: Family Medicine

## 2023-09-12 ENCOUNTER — Other Ambulatory Visit: Payer: Self-pay | Admitting: Family Medicine

## 2023-09-30 ENCOUNTER — Other Ambulatory Visit: Payer: Self-pay | Admitting: Family Medicine

## 2023-09-30 DIAGNOSIS — E1165 Type 2 diabetes mellitus with hyperglycemia: Secondary | ICD-10-CM

## 2023-09-30 DIAGNOSIS — E039 Hypothyroidism, unspecified: Secondary | ICD-10-CM

## 2023-09-30 DIAGNOSIS — E1169 Type 2 diabetes mellitus with other specified complication: Secondary | ICD-10-CM

## 2023-10-02 ENCOUNTER — Other Ambulatory Visit: Payer: Self-pay | Admitting: Family Medicine

## 2023-10-02 DIAGNOSIS — E1169 Type 2 diabetes mellitus with other specified complication: Secondary | ICD-10-CM

## 2023-10-02 DIAGNOSIS — F331 Major depressive disorder, recurrent, moderate: Secondary | ICD-10-CM

## 2023-10-02 DIAGNOSIS — F411 Generalized anxiety disorder: Secondary | ICD-10-CM

## 2023-11-28 ENCOUNTER — Other Ambulatory Visit: Payer: Self-pay | Admitting: *Deleted

## 2023-11-28 DIAGNOSIS — F411 Generalized anxiety disorder: Secondary | ICD-10-CM

## 2023-11-28 DIAGNOSIS — F331 Major depressive disorder, recurrent, moderate: Secondary | ICD-10-CM

## 2023-11-28 MED ORDER — CITALOPRAM HYDROBROMIDE 20 MG PO TABS: 20.0000 mg | ORAL_TABLET | Freq: Every day | ORAL | 0 refills | Status: DC

## 2023-11-28 NOTE — Telephone Encounter (Signed)
[name] NTBS for 3 mos FU  RF sent to pharmacy

## 2023-12-29 ENCOUNTER — Other Ambulatory Visit: Payer: Self-pay | Admitting: Family Medicine

## 2023-12-29 ENCOUNTER — Ambulatory Visit: Payer: BC Managed Care – PPO | Admitting: Family Medicine

## 2023-12-29 DIAGNOSIS — E1169 Type 2 diabetes mellitus with other specified complication: Secondary | ICD-10-CM

## 2023-12-29 DIAGNOSIS — E1165 Type 2 diabetes mellitus with hyperglycemia: Secondary | ICD-10-CM

## 2024-01-15 ENCOUNTER — Ambulatory Visit: Payer: BC Managed Care – PPO | Admitting: Family Medicine

## 2024-01-15 ENCOUNTER — Encounter: Payer: Self-pay | Admitting: Family Medicine

## 2024-01-15 VITALS — BP 135/84 | HR 98 | Temp 97.9°F | Ht 66.0 in | Wt 196.6 lb

## 2024-01-15 DIAGNOSIS — E039 Hypothyroidism, unspecified: Secondary | ICD-10-CM | POA: Diagnosis not present

## 2024-01-15 DIAGNOSIS — Z794 Long term (current) use of insulin: Secondary | ICD-10-CM

## 2024-01-15 DIAGNOSIS — E1159 Type 2 diabetes mellitus with other circulatory complications: Secondary | ICD-10-CM

## 2024-01-15 DIAGNOSIS — Z7984 Long term (current) use of oral hypoglycemic drugs: Secondary | ICD-10-CM

## 2024-01-15 DIAGNOSIS — E1165 Type 2 diabetes mellitus with hyperglycemia: Secondary | ICD-10-CM

## 2024-01-15 DIAGNOSIS — F411 Generalized anxiety disorder: Secondary | ICD-10-CM

## 2024-01-15 DIAGNOSIS — I152 Hypertension secondary to endocrine disorders: Secondary | ICD-10-CM | POA: Diagnosis not present

## 2024-01-15 DIAGNOSIS — E1169 Type 2 diabetes mellitus with other specified complication: Secondary | ICD-10-CM

## 2024-01-15 DIAGNOSIS — E785 Hyperlipidemia, unspecified: Secondary | ICD-10-CM

## 2024-01-15 DIAGNOSIS — F331 Major depressive disorder, recurrent, moderate: Secondary | ICD-10-CM

## 2024-01-15 DIAGNOSIS — E66811 Obesity, class 1: Secondary | ICD-10-CM

## 2024-01-15 DIAGNOSIS — E1129 Type 2 diabetes mellitus with other diabetic kidney complication: Secondary | ICD-10-CM

## 2024-01-15 DIAGNOSIS — Z6832 Body mass index (BMI) 32.0-32.9, adult: Secondary | ICD-10-CM

## 2024-01-15 LAB — BAYER DCA HB A1C WAIVED: HB A1C (BAYER DCA - WAIVED): 14 % — ABNORMAL HIGH (ref 4.8–5.6)

## 2024-01-15 MED ORDER — EMPAGLIFLOZIN 25 MG PO TABS: 25.0000 mg | ORAL_TABLET | Freq: Every day | ORAL | 1 refills | Status: DC

## 2024-01-15 NOTE — Patient Instructions (Signed)
 [email or web address]

## 2024-01-15 NOTE — Progress Notes (Signed)
 Established Patient Office Visit  Subjective   Patient ID: Krista Tate, female    DOB: [birth year]  Age: 43 y.o. MRN: [id number]  Chief Complaint  Patient presents with   Medical Management of Chronic Issues    DM Patient denies foot ulcerations, hyperglycemia, nausea, paresthesia of the feet, polydipsia, polyuria, visual disturbances, and vomiting.    Compliant with meds - metformin and glimepiride. Has been out of tresiba for 1 month. Hasn't called regarding samples. Tried jardiance- did well but unable to afford. Reports that she can only afford metformin and glimepiride due to HD plan. States all other DM meds and insulin are $400+ a month.   Didn't schedule appt with Raynelle Fanning for patient assistance.   Current monitoring regimen: none Current diet:  regular Current exercise: none  2. HTN Complaint with meds - Yes Current Medications - losartan 50 mg Pertinent ROS:  Visual Disturbances - No Chest pain - No Dyspnea - No Palpitations - No LE edema - No  3. Hypothyroid Compliant with medications - Yes Current medications - levothyroxine 150 mcg Weight -  stable Bowel habit changes - No Heat or cold intolerance - No Mood changes - No Changes in sleep habits - No Fatigue - No Skin, hair, or nail changes - No Tremor - No Palpitations - No Edema - No Shortness of breath - No  4. Anxiety/depression On celexa. Well controlled per patient.      01/15/2024   10:50 AM 08/08/2023    8:52 AM 12/19/2022   11:36 AM  Depression screen PHQ 2/9  Decreased Interest 1 1 1   Down, Depressed, Hopeless 1 1 1   PHQ - 2 Score 2 2 2   Altered sleeping 1 1 2   Tired, decreased energy 1 1 1   Change in appetite 1 1 1   Feeling bad or failure about yourself  1 1 1   Trouble concentrating 1 1 1   Moving slowly or fidgety/restless 0 0 0  Suicidal thoughts 0 1 1  PHQ-9 Score 7 8 9   Difficult doing work/chores Somewhat difficult Somewhat difficult Somewhat difficult      08/08/2023    8:51  AM 12/19/2022   11:36 AM 06/17/2022   10:02 AM 08/29/2021    9:13 AM  GAD 7 : Generalized Anxiety Score  Nervous, Anxious, on Edge 2 2 2 1   Control/stop worrying 2 2 2 3   Worry too much - different things 2 2 1 1   Trouble relaxing 2 2 2 2   Restless 2 2 1 2   Easily annoyed or irritable 2 2 1 1   Afraid - awful might happen 2 2 0 1  Total GAD 7 Score 14 14 9 11   Anxiety Difficulty Somewhat difficult Somewhat difficult Somewhat difficult Somewhat difficult     ROS As per HPI.    Objective:     BP 135/84   Pulse 98   Temp 97.9 F (36.6 C) (Temporal)   Ht 5\' 6"  (1.676 m)   Wt 196 lb 9.6 oz (89.2 kg)   SpO2 99%   BMI 31.73 kg/m  BP Readings from Last 3 Encounters:  01/15/24 135/84  08/08/23 (!) 152/92  04/17/23 (!) 149/90   Wt Readings from Last 3 Encounters:  01/15/24 196 lb 9.6 oz (89.2 kg)  08/08/23 197 lb 2 oz (89.4 kg)  04/17/23 199 lb (90.3 kg)     Physical Exam Vitals and nursing note reviewed.  Constitutional:      General: She is not in acute distress.  Appearance: She is not ill-appearing, toxic-appearing or diaphoretic.  Eyes:     General: No scleral icterus. Neck:     Thyroid: No thyroid mass, thyromegaly or thyroid tenderness.  Cardiovascular:     Rate and Rhythm: Normal rate and regular rhythm.     Heart sounds: Normal heart sounds. No murmur heard. Pulmonary:     Effort: Pulmonary effort is normal. No respiratory distress.     Breath sounds: Normal breath sounds.  Abdominal:     General: Bowel sounds are normal. There is no distension.     Palpations: Abdomen is soft.     Tenderness: There is no abdominal tenderness. There is no guarding or rebound.  Musculoskeletal:     Cervical back: Normal range of motion. No rigidity.     Right lower leg: No edema.     Left lower leg: No edema.  Skin:    General: Skin is warm and dry.  Neurological:     General: No focal deficit present.     Mental Status: She is alert and oriented to person, place,  and time.  Psychiatric:        Mood and Affect: Mood normal.        Behavior: Behavior normal.    No results found for any visits on 01/15/24.  Last CBC Lab Results  Component Value Date   WBC 7.9 08/08/2023   HGB 13.1 08/08/2023   HCT 42.8 08/08/2023   MCV 85 08/08/2023   MCH 26.1 (L) 08/08/2023   RDW 13.1 08/08/2023   PLT 385 08/08/2023   Last metabolic panel Lab Results  Component Value Date   GLUCOSE 390 (H) 08/08/2023   NA 134 08/08/2023   K 4.6 08/08/2023   CL 96 08/08/2023   CO2 22 08/08/2023   BUN 16 08/08/2023   CREATININE 0.97 08/08/2023   EGFR 74 08/08/2023   CALCIUM 9.5 08/08/2023   PROT 6.7 08/08/2023   ALBUMIN 4.1 08/08/2023   LABGLOB 2.6 08/08/2023   AGRATIO 1.8 12/19/2022   BILITOT <0.2 08/08/2023   ALKPHOS 48 08/08/2023   AST 21 08/08/2023   ALT 23 08/08/2023   Last lipids Lab Results  Component Value Date   CHOL 145 12/19/2022   HDL 34 (L) 12/19/2022   LDLCALC 73 12/19/2022   TRIG 230 (H) 12/19/2022   CHOLHDL 4.3 12/19/2022   Last hemoglobin A1c Lab Results  Component Value Date   HGBA1C 14.0 (H) 08/08/2023      The 10-year ASCVD risk score (Arnett DK, et al., 2019) is: 2.6%    Assessment & Plan:   Krista Tate was seen today for medical management of chronic issues.  Diagnoses and all orders for this visit:  Uncontrolled type 2 diabetes mellitus with hyperglycemia (HCC) A1c >14 today, not goal of <7. Medication changes today: restart jardiance. Discussed copay card for jardiance. Website info given for this. Sample of tresiba given. Declines referral for patient assistance. Patient unable to afford other medications with HD plan. She is on an ACE/ARB and statin. Declined foot exam today. Diet and exercise.  -     Bayer DCA Hb A1c Waived -     empagliflozin (JARDIANCE) 25 MG TABS tablet; Take 1 tablet (25 mg total) by mouth daily before breakfast.  Long term current use of oral hypoglycemic drug  Long term current use of insulin  (HCC)  Hypertension associated with diabetes (HCC) Monitor BP at home and notify for elevated readings.  -     CBC with  Differential/Platelet -     CMP14+EGFR  Acquired hypothyroidism Labs pending.  -     TSH -     T4, Free  Dyslipidemia associated with type 2 diabetes mellitus (HCC) Last LDL at 73. On statin, zetia.   Microalbuminuria due to type 2 diabetes mellitus (HCC) On ARB. Restart jardiance.   Depression, major, recurrent, moderate (HCC) Generalized anxiety disorder Continue celexa. Denies SI.   Class 1 obesity due to excess calories with serious comorbidity and body mass index (BMI) of 32.0 to 32.9 in adult Weight trending down though she may be due to uncontrolled DM.    Return in about 3 months (around 04/16/2024) for chronic follow up.   The patient indicates understanding of these issues and agrees with the plan.  Gabriel Earing, FNP

## 2024-01-16 ENCOUNTER — Encounter: Payer: Self-pay | Admitting: Family Medicine

## 2024-01-16 LAB — CBC WITH DIFFERENTIAL/PLATELET
Basophils Absolute: 0.1 10*3/uL (ref 0.0–0.2)
Basos: 1 %
EOS (ABSOLUTE): 0.4 10*3/uL (ref 0.0–0.4)
Eos: 5 %
Hematocrit: 43.5 % (ref 34.0–46.6)
Hemoglobin: 13.7 g/dL (ref 11.1–15.9)
Immature Grans (Abs): 0 10*3/uL (ref 0.0–0.1)
Immature Granulocytes: 0 %
Lymphocytes Absolute: 2.3 10*3/uL (ref 0.7–3.1)
Lymphs: 33 %
MCH: 26.8 pg (ref 26.6–33.0)
MCHC: 31.5 g/dL (ref 31.5–35.7)
MCV: 85 fL (ref 79–97)
Monocytes Absolute: 0.5 10*3/uL (ref 0.1–0.9)
Monocytes: 7 %
Neutrophils Absolute: 3.8 10*3/uL (ref 1.4–7.0)
Neutrophils: 54 %
Platelets: 425 10*3/uL (ref 150–450)
RBC: 5.12 x10E6/uL (ref 3.77–5.28)
RDW: 12.2 % (ref 11.7–15.4)
WBC: 7 10*3/uL (ref 3.4–10.8)

## 2024-01-16 LAB — CMP14+EGFR
ALT: 30 IU/L (ref 0–32)
AST: 26 IU/L (ref 0–40)
Albumin: 3.8 g/dL — ABNORMAL LOW (ref 3.9–4.9)
Alkaline Phosphatase: 40 IU/L — ABNORMAL LOW (ref 44–121)
BUN/Creatinine Ratio: 22 (ref 9–23)
BUN: 21 mg/dL (ref 6–24)
Bilirubin Total: 0.2 mg/dL (ref 0.0–1.2)
CO2: 23 mmol/L (ref 20–29)
Calcium: 9.8 mg/dL (ref 8.7–10.2)
Chloride: 98 mmol/L (ref 96–106)
Creatinine, Ser: 0.95 mg/dL (ref 0.57–1.00)
Globulin, Total: 2.6 g/dL (ref 1.5–4.5)
Glucose: 385 mg/dL — ABNORMAL HIGH (ref 70–99)
Potassium: 5.2 mmol/L (ref 3.5–5.2)
Sodium: 134 mmol/L (ref 134–144)
Total Protein: 6.4 g/dL (ref 6.0–8.5)
eGFR: 76 mL/min/{1.73_m2} (ref >59)

## 2024-01-16 LAB — TSH: TSH: 3.04 u[IU]/mL (ref 0.450–4.500)

## 2024-01-16 LAB — T4, FREE: Free T4: 1.95 ng/dL — ABNORMAL HIGH (ref 0.82–1.77)

## 2024-01-16 NOTE — Progress Notes (Signed)
 T4 and alk phos stable. Albumin slightly low. Recommend increasing protein in diet. Elevated BG and A1C, patient to follow [name]'s instructions as discussed in visit: restart jardiance, tresiba, continue other medications.

## 2024-03-21 ENCOUNTER — Other Ambulatory Visit: Payer: Self-pay | Admitting: Family Medicine

## 2024-03-21 DIAGNOSIS — E1165 Type 2 diabetes mellitus with hyperglycemia: Secondary | ICD-10-CM

## 2024-03-22 ENCOUNTER — Encounter: Payer: Self-pay | Admitting: Family Medicine

## 2024-03-22 NOTE — Telephone Encounter (Signed)
 Letter sent.

## 2024-03-22 NOTE — Telephone Encounter (Signed)
 [name] NTBS in June for 3 mos FU RF sent to mail order pharmacy

## 2024-03-24 ENCOUNTER — Telehealth: Payer: Self-pay | Admitting: Family Medicine

## 2024-03-24 NOTE — Telephone Encounter (Signed)
 Left detailed message for patient to call back to let us  know if they currently see an eye doctor to have the health of the eyes checked with having diabetes and if not, we do offer retinal imaging appointments here, to check the health of our patients eyes, if she would like to schedule an appt.  Also, patient will be due for an appt with PCP on 6/13 for a chronic follow up, that has not been scheduled yet. Needs to be scheduled because providers are booking out.

## 2024-03-31 ENCOUNTER — Telehealth: Payer: Self-pay | Admitting: Family Medicine

## 2024-03-31 DIAGNOSIS — F411 Generalized anxiety disorder: Secondary | ICD-10-CM

## 2024-03-31 DIAGNOSIS — F331 Major depressive disorder, recurrent, moderate: Secondary | ICD-10-CM

## 2024-03-31 NOTE — Telephone Encounter (Unsigned)
 Copied from CRM [id number]. Topic: Clinical - Medication Refill >> Mar 31, 2024  1:55 PM Lynnie Saucier S wrote: Medication: citalopram  (CELEXA ) 20 MG tablet  Has the patient contacted their pharmacy? No (Agent: If no, request that the patient contact the pharmacy for the refill. If patient does not wish to contact the pharmacy document the reason why and proceed with request.) (Agent: If yes, when and what did the pharmacy advise?)  This is the patient's preferred pharmacy:  [facility] [address] [place] - [address] [place] [address] Phone: [phone] Fax: [phone]  Is this the correct pharmacy for this prescription? Yes If no, delete pharmacy and type the correct one.   Has the prescription been filled recently? No  Is the patient out of the medication? No  Has the patient been seen for an appointment in the last year OR does the patient have an upcoming appointment? Yes  Can we respond through MyChart? Yes  Agent: Please be advised that Rx refills may take up to 3 business days. We ask that you follow-up with your pharmacy.

## 2024-04-01 MED ORDER — CITALOPRAM HYDROBROMIDE 20 MG PO TABS: 20.0000 mg | ORAL_TABLET | Freq: Every day | ORAL | 0 refills | Status: DC

## 2024-04-02 ENCOUNTER — Other Ambulatory Visit: Payer: Self-pay | Admitting: Family Medicine

## 2024-04-02 ENCOUNTER — Ambulatory Visit: Admitting: Family Medicine

## 2024-04-05 ENCOUNTER — Encounter: Payer: Self-pay | Admitting: Family Medicine

## 2024-04-05 ENCOUNTER — Ambulatory Visit: Admitting: Family Medicine

## 2024-04-05 VITALS — BP 138/83 | HR 103 | Temp 99.0°F | Ht 66.0 in | Wt 195.4 lb

## 2024-04-05 DIAGNOSIS — J0140 Acute pansinusitis, unspecified: Secondary | ICD-10-CM

## 2024-04-05 DIAGNOSIS — E1159 Type 2 diabetes mellitus with other circulatory complications: Secondary | ICD-10-CM | POA: Diagnosis not present

## 2024-04-05 DIAGNOSIS — E1122 Type 2 diabetes mellitus with diabetic chronic kidney disease: Secondary | ICD-10-CM

## 2024-04-05 DIAGNOSIS — E039 Hypothyroidism, unspecified: Secondary | ICD-10-CM

## 2024-04-05 DIAGNOSIS — E1169 Type 2 diabetes mellitus with other specified complication: Secondary | ICD-10-CM

## 2024-04-05 DIAGNOSIS — F411 Generalized anxiety disorder: Secondary | ICD-10-CM

## 2024-04-05 DIAGNOSIS — Z794 Long term (current) use of insulin: Secondary | ICD-10-CM | POA: Diagnosis not present

## 2024-04-05 DIAGNOSIS — E785 Hyperlipidemia, unspecified: Secondary | ICD-10-CM

## 2024-04-05 DIAGNOSIS — F331 Major depressive disorder, recurrent, moderate: Secondary | ICD-10-CM

## 2024-04-05 DIAGNOSIS — Z7984 Long term (current) use of oral hypoglycemic drugs: Secondary | ICD-10-CM

## 2024-04-05 DIAGNOSIS — I152 Hypertension secondary to endocrine disorders: Secondary | ICD-10-CM

## 2024-04-05 LAB — BAYER DCA HB A1C WAIVED: HB A1C (BAYER DCA - WAIVED): 14 % — ABNORMAL HIGH (ref 4.8–5.6)

## 2024-04-05 MED ORDER — PIOGLITAZONE HCL 15 MG PO TABS: 15.0000 mg | ORAL_TABLET | Freq: Every day | ORAL | 3 refills | Status: DC

## 2024-04-05 MED ORDER — AMOXICILLIN-POT CLAVULANATE 875-125 MG PO TABS: 1.0000 | ORAL_TABLET | Freq: Two times a day (BID) | ORAL | 0 refills | Status: AC

## 2024-04-05 NOTE — Progress Notes (Signed)
 Established Patient Office Visit  Subjective   Patient ID: Krista Tate, female    DOB: 1981-03-09  Age: 43 y.o. MRN: [id number]  Chief Complaint  Patient presents with   Medical Management of Chronic Issues    DM Patient denies foot ulcerations, hyperglycemia, nausea, paresthesia of the feet, polydipsia, polyuria, visual disturbances, and vomiting.    Compliant with meds - yes for metformin  and glimepiride . Has used some samples of tresiba  for the last 2 weeks but cannot afford this. Tried jardiance - did well but unable to afford. Reports that she can only afford metformin  and glimepiride  due to HD plan. States all other DM meds and insulin are $400+ a month.   Didn't schedule appt with Julie for patient assistance.   Current monitoring regimen: none Current diet: regular Current exercise: none  2. HTN Complaint with meds - Yes Current Medications - losartan  50 mg Pertinent ROS:  Visual Disturbances - No Chest pain - No Dyspnea - No Palpitations - No LE edema - No  3. Hypothyroid Compliant with medications - Yes Current medications - levothyroxine  150 mcg Weight -  stable Bowel habit changes - No Heat or cold intolerance - No Mood changes - No Changes in sleep habits - No Fatigue - No Skin, hair, or nail changes - No Tremor - No Palpitations - No Edema - No Shortness of breath - No  4. Anxiety/depression On celexa . Stable.   5. Sinus Sinus pressure around face for about 1 week, getting worse. Purulent nasal drainage. Cough- nonproductive. Had chills yesterday. No fever. Nausea, no vomiting. Denies chest pain, wheezing, or shortness of breath.      04/05/2024    1:23 PM 01/15/2024   10:50 AM 08/08/2023    8:52 AM  Depression screen PHQ 2/9  Decreased Interest 1 1 1   Down, Depressed, Hopeless 1 1 1   PHQ - 2 Score 2 2 2   Altered sleeping 2 1 1   Tired, decreased energy 2 1 1   Change in appetite 1 1 1   Feeling bad or failure about yourself  1 1 1   Trouble  concentrating 1 1 1   Moving slowly or fidgety/restless 0 0 0  Suicidal thoughts 1 0 1  PHQ-9 Score 10 7 8   Difficult doing work/chores Somewhat difficult Somewhat difficult Somewhat difficult      04/05/2024    1:23 PM 08/08/2023    8:51 AM 12/19/2022   11:36 AM 06/17/2022   10:02 AM  GAD 7 : Generalized Anxiety Score  Nervous, Anxious, on Edge 1 2 2 2   Control/stop worrying 2 2 2 2   Worry too much - different things 1 2 2 1   Trouble relaxing 2 2 2 2   Restless 1 2 2 1   Easily annoyed or irritable 2 2 2 1   Afraid - awful might happen 1 2 2  0  Total GAD 7 Score 10 14 14 9   Anxiety Difficulty Somewhat difficult Somewhat difficult Somewhat difficult Somewhat difficult     ROS As per HPI.    Objective:     BP 138/83   Pulse (!) 103   Temp 99 F (37.2 C) (Temporal)   Ht 5\' 6"  (1.676 m)   Wt 195 lb 6.4 oz (88.6 kg)   SpO2 97%   BMI 31.54 kg/m  BP Readings from Last 3 Encounters:  04/05/24 138/83  01/15/24 135/84  08/08/23 (!) 152/92   Wt Readings from Last 3 Encounters:  04/05/24 195 lb 6.4 oz (88.6 kg)  01/15/24 196  lb 9.6 oz (89.2 kg)  08/08/23 197 lb 2 oz (89.4 kg)     Physical Exam Vitals and nursing note reviewed.  Constitutional:      General: She is not in acute distress.    Appearance: She is not ill-appearing, toxic-appearing or diaphoretic.  Eyes:     General: No scleral icterus. Neck:     Thyroid : No thyroid  mass, thyromegaly or thyroid  tenderness.  Cardiovascular:     Rate and Rhythm: Normal rate and regular rhythm.     Heart sounds: Normal heart sounds. No murmur heard. Pulmonary:     Effort: Pulmonary effort is normal. No respiratory distress.     Breath sounds: Normal breath sounds.  Abdominal:     General: Bowel sounds are normal. There is no distension.     Palpations: Abdomen is soft.     Tenderness: There is no abdominal tenderness. There is no guarding or rebound.  Musculoskeletal:     Cervical back: Normal range of motion. No rigidity.      Right lower leg: No edema.     Left lower leg: No edema.  Skin:    General: Skin is warm and dry.  Neurological:     General: No focal deficit present.     Mental Status: She is alert and oriented to person, place, and time.  Psychiatric:        Mood and Affect: Mood normal.        Behavior: Behavior normal.    No results found for any visits on 04/05/24.  Last CBC Lab Results  Component Value Date   WBC 7.0 01/15/2024   HGB 13.7 01/15/2024   HCT 43.5 01/15/2024   MCV 85 01/15/2024   MCH 26.8 01/15/2024   RDW 12.2 01/15/2024   PLT 425 01/15/2024   Last metabolic panel Lab Results  Component Value Date   GLUCOSE 385 (H) 01/15/2024   NA 134 01/15/2024   K 5.2 01/15/2024   CL 98 01/15/2024   CO2 23 01/15/2024   BUN 21 01/15/2024   CREATININE 0.95 01/15/2024   EGFR 76 01/15/2024   CALCIUM  9.8 01/15/2024   PROT 6.4 01/15/2024   ALBUMIN 3.8 (L) 01/15/2024   LABGLOB 2.6 01/15/2024   AGRATIO 1.8 12/19/2022   BILITOT 0.2 01/15/2024   ALKPHOS 40 (L) 01/15/2024   AST 26 01/15/2024   ALT 30 01/15/2024   Last lipids Lab Results  Component Value Date   CHOL 145 12/19/2022   HDL 34 (L) 12/19/2022   LDLCALC 73 12/19/2022   TRIG 230 (H) 12/19/2022   CHOLHDL 4.3 12/19/2022   Last hemoglobin A1c Lab Results  Component Value Date   HGBA1C 14.0 (H) 01/15/2024      The 10-year ASCVD risk score (Arnett DK, et al., 2019) is: 2.7%    Assessment & Plan:   Krista Tate was seen today for medical management of chronic issues.  Diagnoses and all orders for this visit:  Type 2 diabetes mellitus with chronic kidney disease, with long-term current use of insulin, unspecified CKD stage (HCC) Uncontrolled. A1c >14. Samples of tresiba  given. Instructed patient to call for additional samples once she is close to finishing up these samples. Continue glimepiride  and metformin . Will start actos since she has difficultly with medication costs with HD plan. She has not follow through  with patient assistance referral.  -     Bayer DCA Hb A1c Waived -     CMP14+EGFR -     VITAMIN D 25 Hydroxy (  Vit-D Deficiency, Fractures) -     pioglitazone (ACTOS) 15 MG tablet; Take 1 tablet (15 mg total) by mouth daily.  Long term current use of oral hypoglycemic drug  Hypertension associated with diabetes (HCC) Well controlled on current regimen.   Dyslipidemia associated with type 2 diabetes mellitus (HCC) Last LDL at 73. On statin and tricor .   Acquired hypothyroidism Complaint with levothyroxine .   Depression, major, recurrent, moderate (HCC) Generalized anxiety disorder Continue celexa .   Acute non-recurrent pansinusitis -     amoxicillin-clavulanate (AUGMENTIN) 875-125 MG tablet; Take 1 tablet by mouth 2 (two) times daily for 7 days.  Return in about 3 months (around 07/06/2024) for chronic follow up.   The patient indicates understanding of these issues and agrees with the plan.  Albertha Huger, FNP

## 2024-04-06 LAB — CMP14+EGFR
ALT: 23 IU/L (ref 0–32)
AST: 18 IU/L (ref 0–40)
Albumin: 3.8 g/dL — ABNORMAL LOW (ref 3.9–4.9)
Alkaline Phosphatase: 40 IU/L — ABNORMAL LOW (ref 44–121)
BUN/Creatinine Ratio: 18 (ref 9–23)
BUN: 19 mg/dL (ref 6–24)
Bilirubin Total: 0.2 mg/dL (ref 0.0–1.2)
CO2: 22 mmol/L (ref 20–29)
Calcium: 9.7 mg/dL (ref 8.7–10.2)
Chloride: 98 mmol/L (ref 96–106)
Creatinine, Ser: 1.08 mg/dL — ABNORMAL HIGH (ref 0.57–1.00)
Globulin, Total: 2.7 g/dL (ref 1.5–4.5)
Glucose: 371 mg/dL — ABNORMAL HIGH (ref 70–99)
Potassium: 5.7 mmol/L — ABNORMAL HIGH (ref 3.5–5.2)
Sodium: 134 mmol/L (ref 134–144)
Total Protein: 6.5 g/dL (ref 6.0–8.5)
eGFR: 65 mL/min/{1.73_m2} (ref >59)

## 2024-04-06 LAB — VITAMIN D 25 HYDROXY (VIT D DEFICIENCY, FRACTURES): Vit D, 25-Hydroxy: 26.8 ng/mL — ABNORMAL LOW (ref 30.0–100.0)

## 2024-04-07 ENCOUNTER — Ambulatory Visit: Payer: Self-pay | Admitting: Family Medicine

## 2024-04-07 DIAGNOSIS — E559 Vitamin D deficiency, unspecified: Secondary | ICD-10-CM

## 2024-04-07 DIAGNOSIS — E875 Hyperkalemia: Secondary | ICD-10-CM

## 2024-04-07 MED ORDER — VITAMIN D (ERGOCALCIFEROL) 1.25 MG (50000 UNIT) PO CAPS: 50000.0000 [IU] | ORAL_CAPSULE | ORAL | 0 refills | Status: DC

## 2024-04-13 ENCOUNTER — Other Ambulatory Visit: Payer: Self-pay | Admitting: Family Medicine

## 2024-04-13 DIAGNOSIS — E1169 Type 2 diabetes mellitus with other specified complication: Secondary | ICD-10-CM

## 2024-04-19 ENCOUNTER — Telehealth: Payer: Self-pay | Admitting: Family Medicine

## 2024-05-18 ENCOUNTER — Telehealth: Payer: Self-pay | Admitting: Family Medicine

## 2024-06-17 ENCOUNTER — Other Ambulatory Visit: Payer: Self-pay | Admitting: Family Medicine

## 2024-06-17 DIAGNOSIS — E1165 Type 2 diabetes mellitus with hyperglycemia: Secondary | ICD-10-CM

## 2024-06-17 DIAGNOSIS — E039 Hypothyroidism, unspecified: Secondary | ICD-10-CM

## 2024-06-25 ENCOUNTER — Other Ambulatory Visit: Payer: Self-pay | Admitting: *Deleted

## 2024-06-25 DIAGNOSIS — E1122 Type 2 diabetes mellitus with diabetic chronic kidney disease: Secondary | ICD-10-CM

## 2024-06-25 MED ORDER — PIOGLITAZONE HCL 15 MG PO TABS: 15.0000 mg | ORAL_TABLET | Freq: Every day | ORAL | 2 refills | Status: AC

## 2024-06-29 ENCOUNTER — Other Ambulatory Visit: Payer: Self-pay | Admitting: Family Medicine

## 2024-06-29 DIAGNOSIS — F331 Major depressive disorder, recurrent, moderate: Secondary | ICD-10-CM

## 2024-06-29 DIAGNOSIS — F411 Generalized anxiety disorder: Secondary | ICD-10-CM

## 2024-07-12 ENCOUNTER — Other Ambulatory Visit: Payer: Self-pay | Admitting: Family Medicine

## 2024-07-12 DIAGNOSIS — I152 Hypertension secondary to endocrine disorders: Secondary | ICD-10-CM

## 2024-07-19 ENCOUNTER — Ambulatory Visit: Admitting: Family Medicine

## 2024-07-19 ENCOUNTER — Encounter: Payer: Self-pay | Admitting: Family Medicine

## 2024-07-19 VITALS — BP 140/87 | HR 99 | Temp 97.8°F | Ht 66.0 in | Wt 206.4 lb

## 2024-07-19 DIAGNOSIS — Z794 Long term (current) use of insulin: Secondary | ICD-10-CM | POA: Diagnosis not present

## 2024-07-19 DIAGNOSIS — E1159 Type 2 diabetes mellitus with other circulatory complications: Secondary | ICD-10-CM | POA: Diagnosis not present

## 2024-07-19 DIAGNOSIS — E559 Vitamin D deficiency, unspecified: Secondary | ICD-10-CM

## 2024-07-19 DIAGNOSIS — E1169 Type 2 diabetes mellitus with other specified complication: Secondary | ICD-10-CM

## 2024-07-19 DIAGNOSIS — R7989 Other specified abnormal findings of blood chemistry: Secondary | ICD-10-CM

## 2024-07-19 DIAGNOSIS — E1122 Type 2 diabetes mellitus with diabetic chronic kidney disease: Secondary | ICD-10-CM

## 2024-07-19 DIAGNOSIS — I152 Hypertension secondary to endocrine disorders: Secondary | ICD-10-CM

## 2024-07-19 DIAGNOSIS — E785 Hyperlipidemia, unspecified: Secondary | ICD-10-CM | POA: Diagnosis not present

## 2024-07-19 DIAGNOSIS — R7309 Other abnormal glucose: Secondary | ICD-10-CM | POA: Diagnosis not present

## 2024-07-19 LAB — BAYER DCA HB A1C WAIVED: HB A1C (BAYER DCA - WAIVED): 14 % — ABNORMAL HIGH (ref 4.8–5.6)

## 2024-07-19 NOTE — Progress Notes (Signed)
 Established Patient Office Visit  Subjective   Patient ID: Krista Tate, female    DOB: [birth year]  Age: 43 y.o. MRN: [id number]  Chief Complaint  Patient presents with   Medical Management of Chronic Issues     History of Present Illness   Krista Tate is a 43 year old female with type 2 diabetes who presents for follow-up.  Hyperglycemia and medication management - Type 2 diabetes mellitus with persistent hyperglycemia; most recent A1c remains over 14 - She has been using Tresiba  at 22 units through samples but has been out for about 1 week. This would cost her about 600 with her insurance - Cost barriers with Jardiance  - Continues on metformin , Actos , and glimepiride  - Financial constraints due to high deductible insurance impact medication affordability - Reports that insurance will not cover a short acting insulin - Diet is suboptimal with frequent dining out, influenced by personal stressors including family illness and pet loss  Hypertension management - Hypertension managed with losartan  100 mg daily - No home blood pressure monitoring recently despite having equipment  Hyperlipidemia and medication access - Takes atorvastatin  for hyperlipidemia - Difficulty obtaining Zetia  due to pharmacy issues lately. She will call regarding this  Anxiety symptoms - Anxiety generally controlled with Celexa  - Recent lapse in Celexa  led to increased anxiety  Cardiopulmonary and neurological symptoms - No chest pain, shortness of breath, or peripheral edema - No numbness or tingling in feet; attributes any sensation to back issues          04/05/2024    1:23 PM 01/15/2024   10:50 AM 08/08/2023    8:52 AM  Depression screen PHQ 2/9  Decreased Interest 1 1 1   Down, Depressed, Hopeless 1 1 1   PHQ - 2 Score 2 2 2   Altered sleeping 2 1 1   Tired, decreased energy 2 1 1   Change in appetite 1 1 1   Feeling bad or failure about yourself  1 1 1   Trouble concentrating 1 1 1   Moving  slowly or fidgety/restless 0 0 0  Suicidal thoughts 1 0 1  PHQ-9 Score 10 7 8   Difficult doing work/chores Somewhat difficult Somewhat difficult Somewhat difficult      04/05/2024    1:23 PM 08/08/2023    8:51 AM 12/19/2022   11:36 AM 06/17/2022   10:02 AM  GAD 7 : Generalized Anxiety Score  Nervous, Anxious, on Edge 1 2 2 2   Control/stop worrying 2 2 2 2   Worry too much - different things 1 2 2 1   Trouble relaxing 2 2 2 2   Restless 1 2 2 1   Easily annoyed or irritable 2 2 2 1   Afraid - awful might happen 1 2 2  0  Total GAD 7 Score 10 14 14 9   Anxiety Difficulty Somewhat difficult Somewhat difficult Somewhat difficult Somewhat difficult     ROS As per HPI.    Objective:     BP (!) 140/87   Pulse 99   Temp 97.8 F (36.6 C) (Temporal)   Ht 5' 6 (1.676 m)   Wt 206 lb 6.4 oz (93.6 kg)   SpO2 99%   BMI 33.31 kg/m  BP Readings from Last 3 Encounters:  07/19/24 (!) 140/87  04/05/24 138/83  01/15/24 135/84   Wt Readings from Last 3 Encounters:  07/19/24 206 lb 6.4 oz (93.6 kg)  04/05/24 195 lb 6.4 oz (88.6 kg)  01/15/24 196 lb 9.6 oz (89.2 kg)     Physical Exam Vitals  and nursing note reviewed.  Constitutional:      General: She is not in acute distress.    Appearance: She is not ill-appearing, toxic-appearing or diaphoretic.  Eyes:     General: No scleral icterus. Neck:     Thyroid : No thyroid  mass, thyromegaly or thyroid  tenderness.  Cardiovascular:     Rate and Rhythm: Normal rate and regular rhythm.     Heart sounds: Normal heart sounds. No murmur heard. Pulmonary:     Effort: Pulmonary effort is normal. No respiratory distress.     Breath sounds: Normal breath sounds.  Abdominal:     General: Bowel sounds are normal. There is no distension.     Palpations: Abdomen is soft.     Tenderness: There is no abdominal tenderness. There is no guarding or rebound.  Musculoskeletal:     Cervical back: Normal range of motion. No rigidity.     Right lower leg: No  edema.     Left lower leg: No edema.  Skin:    General: Skin is warm and dry.  Neurological:     General: No focal deficit present.     Mental Status: She is alert and oriented to person, place, and time.  Psychiatric:        Mood and Affect: Mood normal.        Behavior: Behavior normal.    No results found for any visits on 07/19/24.  Last CBC Lab Results  Component Value Date   WBC 7.0 01/15/2024   HGB 13.7 01/15/2024   HCT 43.5 01/15/2024   MCV 85 01/15/2024   MCH 26.8 01/15/2024   RDW 12.2 01/15/2024   PLT 425 01/15/2024   Last metabolic panel Lab Results  Component Value Date   GLUCOSE 371 (H) 04/05/2024   NA 134 04/05/2024   K 5.7 (H) 04/05/2024   CL 98 04/05/2024   CO2 22 04/05/2024   BUN 19 04/05/2024   CREATININE 1.08 (H) 04/05/2024   EGFR 65 04/05/2024   CALCIUM  9.7 04/05/2024   PROT 6.5 04/05/2024   ALBUMIN 3.8 (L) 04/05/2024   LABGLOB 2.7 04/05/2024   AGRATIO 1.8 12/19/2022   BILITOT 0.2 04/05/2024   ALKPHOS 40 (L) 04/05/2024   AST 18 04/05/2024   ALT 23 04/05/2024   Last lipids Lab Results  Component Value Date   CHOL 145 12/19/2022   HDL 34 (L) 12/19/2022   LDLCALC 73 12/19/2022   TRIG 230 (H) 12/19/2022   CHOLHDL 4.3 12/19/2022   Last hemoglobin A1c Lab Results  Component Value Date   HGBA1C 14.0 (H) 04/05/2024      The 10-year ASCVD risk score (Arnett DK, et al., 2019) is: 3%    Assessment & Plan:   Zamani was seen today for medical management of chronic issues.  Diagnoses and all orders for this visit:  Type 2 diabetes mellitus with chronic kidney disease, with long-term current use of insulin, unspecified CKD stage (HCC) -     Bayer DCA Hb A1c Waived -     Vitamin B12 -     AMB Referral VBCI Care Management  Hypertension associated with diabetes (HCC) -     CBC with Differential/Platelet -     CMP14+EGFR  Abnormal thyroid  blood test -     TSH -     T4, Free  Dyslipidemia associated with type 2 diabetes mellitus  (HCC) -     Lipid panel  Vitamin D  insufficiency -     VITAMIN D  25  Hydroxy (Vit-D Deficiency, Fractures)      Type 2 diabetes mellitus with poor glycemic control and diabetic chronic kidney disease A1c over 14. On Tresiba , metformin , Actos , and glimepiride . Jardiance  discontinued due to cost. High deductible insurance limits medication affordability. - No samples of tresiba  are available today - Refer to pharmacist for insulin program assistance  - Schedule appointment with pharmacist  - Monitor blood glucose regularly.  Hypertension Recent elevated blood pressure. On losartan  100 mg. Inconsistent home monitoring. - Instruct to monitor blood pressure at home regularly. - Report readings over 120/80 mmHg for medication adjustment.  Hyperlipidemia Managed with atorvastatin . Unable to obtain Zetia  due to pharmacy issues. - Contact pharmacy to resolve Zetia  acquisition issue.  Depression/anxiety Managed with Celexa . Temporary lapse in medication affected mood.  Dietary habits Poor dietary habits due to stressors. Need for improvement to support health and diabetes management. - Encourage dietary modifications to reduce eating out and improve nutrition.       Return in about 3 months (around 10/18/2024) for chronic follow up.   The patient indicates understanding of these issues and agrees with the plan.  Annabella CHRISTELLA Search, FNP

## 2024-07-20 ENCOUNTER — Other Ambulatory Visit: Payer: Self-pay | Admitting: Family

## 2024-07-20 ENCOUNTER — Telehealth: Payer: Self-pay | Admitting: Family Medicine

## 2024-07-20 LAB — CBC WITH DIFFERENTIAL/PLATELET
Basophils Absolute: 0.1 x10E3/uL (ref 0.0–0.2)
Basos: 1 %
EOS (ABSOLUTE): 0.4 x10E3/uL (ref 0.0–0.4)
Eos: 5 %
Hematocrit: 41.1 % (ref 34.0–46.6)
Hemoglobin: 13 g/dL (ref 11.1–15.9)
Immature Grans (Abs): 0 x10E3/uL (ref 0.0–0.1)
Immature Granulocytes: 0 %
Lymphocytes Absolute: 1.9 x10E3/uL (ref 0.7–3.1)
Lymphs: 25 %
MCH: 27 pg (ref 26.6–33.0)
MCHC: 31.6 g/dL (ref 31.5–35.7)
MCV: 85 fL (ref 79–97)
Monocytes Absolute: 0.5 x10E3/uL (ref 0.1–0.9)
Monocytes: 6 %
Neutrophils Absolute: 4.9 x10E3/uL (ref 1.4–7.0)
Neutrophils: 63 %
Platelets: 445 x10E3/uL (ref 150–450)
RBC: 4.81 x10E6/uL (ref 3.77–5.28)
RDW: 12.5 % (ref 11.7–15.4)
WBC: 7.7 x10E3/uL (ref 3.4–10.8)

## 2024-07-20 LAB — LIPID PANEL
Chol/HDL Ratio: 6.1 ratio — ABNORMAL HIGH (ref 0.0–4.4)
Cholesterol, Total: 218 mg/dL — ABNORMAL HIGH (ref 100–199)
HDL: 36 mg/dL — ABNORMAL LOW (ref >39)
LDL Chol Calc (NIH): 129 mg/dL — ABNORMAL HIGH (ref 0–99)
Triglycerides: 296 mg/dL — ABNORMAL HIGH (ref 0–149)
VLDL Cholesterol Cal: 53 mg/dL — ABNORMAL HIGH (ref 5–40)

## 2024-07-20 LAB — CMP14+EGFR
ALT: 16 IU/L (ref 0–32)
AST: 14 IU/L (ref 0–40)
Albumin: 3.7 g/dL — ABNORMAL LOW (ref 3.9–4.9)
Alkaline Phosphatase: 42 IU/L (ref 41–116)
BUN/Creatinine Ratio: 20 (ref 9–23)
BUN: 21 mg/dL (ref 6–24)
Bilirubin Total: <0.2 mg/dL (ref 0.0–1.2)
CO2: 22 mmol/L (ref 20–29)
Calcium: 9.6 mg/dL (ref 8.7–10.2)
Chloride: 99 mmol/L (ref 96–106)
Creatinine, Ser: 1.07 mg/dL — ABNORMAL HIGH (ref 0.57–1.00)
Globulin, Total: 2.8 g/dL (ref 1.5–4.5)
Glucose: 463 mg/dL (ref 70–99)
Potassium: 5.2 mmol/L (ref 3.5–5.2)
Sodium: 135 mmol/L (ref 134–144)
Total Protein: 6.5 g/dL (ref 6.0–8.5)
eGFR: 66 mL/min/1.73 (ref >59)

## 2024-07-20 LAB — VITAMIN B12: Vitamin B-12: 344 pg/mL (ref 232–1245)

## 2024-07-20 LAB — T4, FREE: Free T4: 1.83 ng/dL — ABNORMAL HIGH (ref 0.82–1.77)

## 2024-07-20 LAB — VITAMIN D 25 HYDROXY (VIT D DEFICIENCY, FRACTURES): Vit D, 25-Hydroxy: 21.8 ng/mL — ABNORMAL LOW (ref 30.0–100.0)

## 2024-07-20 LAB — TSH: TSH: 2.51 u[IU]/mL (ref 0.450–4.500)

## 2024-07-20 NOTE — Telephone Encounter (Signed)
 Critical lab glucose 463

## 2024-07-20 NOTE — Telephone Encounter (Signed)
 Called and spoke with boyfriend who is on DPR states that she is at work but he would have her call us  back to answer the questions because he is unaware if she has insulin

## 2024-07-20 NOTE — Telephone Encounter (Signed)
 Glucose is very elevated, but has been her base line. Reviewed PCP's note. Pt has been out of insulin. Please make sure she has an appointment with Mliss asap. Also can we verify she was given samples of insulin?

## 2024-07-21 ENCOUNTER — Telehealth: Payer: Self-pay

## 2024-07-21 ENCOUNTER — Ambulatory Visit: Payer: Self-pay | Admitting: Family Medicine

## 2024-07-21 DIAGNOSIS — E559 Vitamin D deficiency, unspecified: Secondary | ICD-10-CM

## 2024-07-21 MED ORDER — VITAMIN D (ERGOCALCIFEROL) 1.25 MG (50000 UNIT) PO CAPS: 50000.0000 [IU] | ORAL_CAPSULE | ORAL | 0 refills | Status: DC

## 2024-07-21 NOTE — Progress Notes (Signed)
 Care Guide Pharmacy Note  07/21/2024 Name: Krista Tate MRN: [id number] DOB: 30-Jul-1981  Referred By: Joesph Annabella HERO, FNP Reason for referral: Complex Care Management (Outreach to schedule with Pharm d )   Krista Tate is a 43 y.o. year old female who is a primary care patient of Joesph Annabella HERO, FNP.  Krista Tate was referred to the pharmacist for assistance related to: DMII  Successful contact was made with the patient to discuss pharmacy services including being ready for the pharmacist to call at least 5 minutes before the scheduled appointment time and to have medication bottles and any blood pressure readings ready for review. The patient agreed to meet with the pharmacist via telephone visit on (date/time).07/22/2024  Jeoffrey Buffalo , RMA     [place]  [facility], [facility] Guide  Direct Dial: [phone]  Website: [email or web address]

## 2024-07-22 ENCOUNTER — Other Ambulatory Visit (INDEPENDENT_AMBULATORY_CARE_PROVIDER_SITE_OTHER)

## 2024-07-22 ENCOUNTER — Other Ambulatory Visit: Payer: Self-pay | Admitting: *Deleted

## 2024-07-22 DIAGNOSIS — E559 Vitamin D deficiency, unspecified: Secondary | ICD-10-CM

## 2024-07-22 DIAGNOSIS — Z794 Long term (current) use of insulin: Secondary | ICD-10-CM

## 2024-07-22 DIAGNOSIS — E1169 Type 2 diabetes mellitus with other specified complication: Secondary | ICD-10-CM

## 2024-07-22 DIAGNOSIS — E119 Type 2 diabetes mellitus without complications: Secondary | ICD-10-CM

## 2024-07-22 MED ORDER — EZETIMIBE 10 MG PO TABS: 10.0000 mg | ORAL_TABLET | Freq: Every day | ORAL | 1 refills | Status: AC

## 2024-07-22 NOTE — Telephone Encounter (Signed)
 Krista Tate has gotten in contact with this patient.

## 2024-07-22 NOTE — Progress Notes (Signed)
 07/22/2024 Name: Krista Tate MRN: [id number] DOB: August 17, 1981  Chief Complaint  Patient presents with   Diabetes    Krista Tate is a 43 y.o. year old female who presented for a telephone visit.   They were referred to the pharmacist by their PCP for assistance in managing diabetes and medication access.    Subjective:  Patient reports she has been 1.5 weeks without insulin due to high deductible insurance plan.  She is unable to afford any copays >$30/month.   Care Team: Primary Care Provider: Joesph Annabella HERO, FNP ;   Medication Access/Adherence  Current Pharmacy:  [facility] [address] [place] - [address] [place] [address] Phone: [phone] Fax: [phone]  [facility] - [place], [place] - [address] [facility] to [facility] [address] [place] [address] Phone: [phone] Fax: [phone]   Patient reports affordability concerns with their medications: Yes  Patient reports access/transportation concerns to their pharmacy: No  Patient reports adherence concerns with their medications:  No     Diabetes:  Current medications: metformin , glimepiride , pioglitazone  Medications tried in the past: tresiba , trulicity , various insulins  Current glucose readings: n/a   Current meal patterns:  Discussed meal planning options and Plate method for healthy eating Avoid sugary drinks and desserts Incorporate balanced protein, non starchy veggies, 1 serving of carbohydrate with each meal Increase water intake Increase physical activity as able   Current physical activity: encouraged as able  Current medication access support: n/a  Macrovascular and Microvascular Risk Reduction:  Statin? yes (atorvastatin ); ACEi/ARB? yes (losartan ) Last urinary albumin/creatinine ratio:  Lab Results  Component Value Date   MICRALBCREAT 1,512 (H) 08/08/2023   MICRALBCREAT 332 (H)  06/17/2022   MICRALBCREAT 275 (H) 03/18/2022   MICRALBCREAT 9.5 11/02/2015   Last eye exam:  Lab Results  Component Value Date   HMDIABEYEEXA No Retinopathy 04/11/2022   Last foot exam: 07/19/2024 Tobacco Use:  Tobacco Use: Low Risk  (07/19/2024)   Patient History    Smoking Tobacco Use: Never    Smokeless Tobacco Use: Never    Passive Exposure: Not on file     Objective:  Lab Results  Component Value Date   HGBA1C 14.0 (H) 07/19/2024    Lab Results  Component Value Date   CREATININE 1.07 (H) 07/19/2024   BUN 21 07/19/2024   NA 135 07/19/2024   K 5.2 07/19/2024   CL 99 07/19/2024   CO2 22 07/19/2024    Lab Results  Component Value Date   CHOL 218 (H) 07/19/2024   HDL 36 (L) 07/19/2024   LDLCALC 129 (H) 07/19/2024   TRIG 296 (H) 07/19/2024   CHOLHDL 6.1 (H) 07/19/2024    Medications Reviewed Today     Reviewed by Billee Mliss BIRCH, [facility] (Pharmacist) on 08/02/24 at 1248  Med List Status: <None>   Medication Order Taking? Sig Documenting Provider Last Dose Status Informant  atorvastatin  (LIPITOR) 80 MG tablet [id number]  TAKE 1 TABLET DAILY Joesph Annabella HERO, FNP  Active   cetirizine (ZYRTEC) 10 MG tablet [id number]  Take 10 mg by mouth daily. [provider]  Active   cholecalciferol (VITAMIN D3) 25 MCG (1000 UNIT) tablet [id number]  Take 1,000 Units by mouth in the morning and at bedtime. [provider]  Active Self  citalopram  (CELEXA ) 20 MG tablet [id number]  TAKE 1 TABLET DAILY Joesph Annabella HERO, FNP  Active   cyclobenzaprine  (FLEXERIL ) 10 MG  tablet [id number]  TAKE 1 TABLET 3 TIMES DAILYAS NEEDED FOR MUSCLE SPASMS Joesph Annabella HERO, FNP  Active   ezetimibe  (ZETIA ) 10 MG tablet [id number]  Take 1 tablet (10 mg total) by mouth daily. Joesph Annabella HERO, FNP  Active   fenofibrate  (TRICOR ) 145 MG tablet [id number]  TAKE 1 TABLET DAILY Joesph Annabella HERO, FNP  Active   ferrous sulfate 324 MG TBEC [id number]  Take 324 mg by mouth in the morning, at noon, and  at bedtime. [provider]  Active Self  glimepiride  (AMARYL ) 4 MG tablet [id number]  TAKE 1 TABLET TWICE A DAY  WITH MEALS Joesph Annabella HERO, FNP  Active   insulin degludec  (TRESIBA  FLEXTOUCH) 100 UNIT/ML FlexTouch Pen [id number]  Inject 18 Units into the skin at bedtime. Joesph Annabella HERO, FNP  Active   levothyroxine  (SYNTHROID ) 150 MCG tablet [id number]  TAKE 1 TABLET DAILY BEFORE BREAKFAST Joesph Annabella HERO, FNP  Active   losartan  (COZAAR ) 100 MG tablet [id number]  TAKE 1 TABLET DAILY Joesph Annabella HERO, FNP  Active   metFORMIN  (GLUCOPHAGE ) 1000 MG tablet [id number]  TAKE 1 TABLET TWICE DAILY  WITH MEALS Joesph Annabella HERO, FNP  Active   Multiple Minerals (CALCIUM -MAGNESIUM-ZINC) TABS [id number]  Take 1 tablet by mouth daily. [provider]  Active Self  pioglitazone  (ACTOS ) 15 MG tablet [id number]  Take 1 tablet (15 mg total) by mouth daily. Joesph Annabella HERO, FNP  Active   Vitamin D , Ergocalciferol , (DRISDOL ) 1.25 MG (50000 UNIT) CAPS capsule [id number]  Take 1 capsule (50,000 Units total) by mouth every 7 (seven) days. Joesph Annabella HERO, FNP  Active               Assessment/Plan:   Diabetes: - Currently uncontrolled; goal A1c <7%. Cardiorenal risk reduction is opportunities for improvement.. Blood pressure is at goal <130/80. LDL is not at goal.  - Reviewed long term cardiovascular and renal outcomes of uncontrolled blood sugar. - Recommend to start tresiba  insulin samples, continue generic medications . -Will explore Mounjaro copays and other DM copays to see if affordable   Follow Up Plan: 3 weeks  Mliss Tarry Griffin, PharmD, BCACP, CPP Clinical Pharmacist, [facility]

## 2024-07-23 ENCOUNTER — Other Ambulatory Visit (HOSPITAL_COMMUNITY): Payer: Self-pay

## 2024-07-23 ENCOUNTER — Telehealth: Payer: Self-pay | Admitting: Pharmacy Technician

## 2024-07-23 NOTE — Progress Notes (Signed)
   07/23/2024 Name: Shanya Ferriss MRN: [id number] DOB: 06-Nov-1980  Patient is appearing on a report for [facility] and last engaged with the clinical pharmacist to discuss diabetes on 07/22/2024. Contacted patient today to discuss diabetes management and completed medication review.   Diabetes Plan from last clinical pharmacist appointment:  HI Kate!  I've been trying to help this lady with high deductible insurance.  Can we check on the following prices?:  Tresiba  insulin with copay card --50 units daily #39mL/#1 box would be 30 day supply  Mounjaro 2.5mg  weekly #2nL/28 day suuply with copay card  We will use [facility] mail order if this works out  NO worries or rush! I've given her Lantus  samples  Thanks! Julie    Medication Adherence Barriers Identified: Access issues with any new medication or testing device: Yes Tresiba  & Mounjaro. Trying to assist patient in finding a regimen that is affordable with patient's high deductible insurance plan.   Medication Adherence Barriers Addressed/Actions Taken:  Reviewed medication changes per plan from last clinical pharmacist note Medication Access for Tresiba  & Mounjaro Will discuss medication access concerns with pharmacist Collaborated with Patient Advocate team regarding test claims Contacted Patient Advocate team regarding new prescriptions Patient Advocate team member ran test claim for [facility] 2.5mg  for a quantity of 2ml for 28 day supply and the cost with patient's insurance and the e-viuocher, the price is $50. Patient Advocate team member ran test claim for Tresiba  100 units/ml, quantity 15ml for 30 day supply and the cost on patient's insurance is $110.95. Was able to obtain a coupon card for the patient     According to the information on the card because patient's copay is above $100 her cost with the savins offer will be $99 as outline here: For commercially insured - not covered patients or where the patient's  out-of-pocket expense is greater than $100 per 30-day supply: Submit the claim to [facility] using BIN [id number]. A valid Other Coverage Code 01 is required. The patient is responsible for the first $99 per 35 mL (maximum of 150 mL per calendar month) and reimbursement will be received from [facility]  Information was sent to PharmD to address with patient.  Next clinical pharmacist appointment is scheduled for: TBD   Kate Caddy, CPhT [facility] Office: [phone] Email: [email or web address]

## 2024-07-26 ENCOUNTER — Other Ambulatory Visit: Payer: Self-pay | Admitting: *Deleted

## 2024-07-26 DIAGNOSIS — E1165 Type 2 diabetes mellitus with hyperglycemia: Secondary | ICD-10-CM

## 2024-08-02 ENCOUNTER — Encounter: Payer: Self-pay | Admitting: Pharmacist

## 2024-08-26 NOTE — Progress Notes (Signed)
 Krista Tate                                          MRN: [id number]   08/26/2024   The VBCI Quality Team Specialist reviewed this patient medical record for the purposes of chart review for care gap closure. The following were reviewed: abstraction for care gap closure-glycemic status assessment.    VBCI Quality Team

## 2024-09-17 ENCOUNTER — Other Ambulatory Visit: Payer: Self-pay | Admitting: *Deleted

## 2024-09-17 DIAGNOSIS — E1165 Type 2 diabetes mellitus with hyperglycemia: Secondary | ICD-10-CM

## 2024-09-17 NOTE — Telephone Encounter (Signed)
 [name] NTBS in Dec for 3 mos FU RF sent to pharmacy

## 2024-09-17 NOTE — Telephone Encounter (Signed)
 I called pt & I spoke to someone. I told them to have pt call [facility] to make an appt w/TM for med refill.

## 2024-10-03 ENCOUNTER — Other Ambulatory Visit: Payer: Self-pay | Admitting: Family Medicine

## 2024-10-03 DIAGNOSIS — E1169 Type 2 diabetes mellitus with other specified complication: Secondary | ICD-10-CM

## 2024-10-05 ENCOUNTER — Other Ambulatory Visit: Payer: Self-pay | Admitting: *Deleted

## 2024-10-05 DIAGNOSIS — F411 Generalized anxiety disorder: Secondary | ICD-10-CM

## 2024-10-05 DIAGNOSIS — F331 Major depressive disorder, recurrent, moderate: Secondary | ICD-10-CM

## 2024-10-07 ENCOUNTER — Ambulatory Visit: Admitting: Family Medicine

## 2024-10-07 VITALS — BP 139/84 | HR 98 | Temp 98.2°F | Ht 66.0 in | Wt 209.8 lb

## 2024-10-07 DIAGNOSIS — E6609 Other obesity due to excess calories: Secondary | ICD-10-CM

## 2024-10-07 DIAGNOSIS — E1169 Type 2 diabetes mellitus with other specified complication: Secondary | ICD-10-CM | POA: Diagnosis not present

## 2024-10-07 DIAGNOSIS — F331 Major depressive disorder, recurrent, moderate: Secondary | ICD-10-CM

## 2024-10-07 DIAGNOSIS — E559 Vitamin D deficiency, unspecified: Secondary | ICD-10-CM

## 2024-10-07 DIAGNOSIS — E039 Hypothyroidism, unspecified: Secondary | ICD-10-CM

## 2024-10-07 DIAGNOSIS — E1165 Type 2 diabetes mellitus with hyperglycemia: Secondary | ICD-10-CM | POA: Diagnosis not present

## 2024-10-07 DIAGNOSIS — I152 Hypertension secondary to endocrine disorders: Secondary | ICD-10-CM

## 2024-10-07 DIAGNOSIS — F411 Generalized anxiety disorder: Secondary | ICD-10-CM

## 2024-10-07 DIAGNOSIS — Z794 Long term (current) use of insulin: Secondary | ICD-10-CM | POA: Diagnosis not present

## 2024-10-07 DIAGNOSIS — E1129 Type 2 diabetes mellitus with other diabetic kidney complication: Secondary | ICD-10-CM

## 2024-10-07 DIAGNOSIS — E1159 Type 2 diabetes mellitus with other circulatory complications: Secondary | ICD-10-CM | POA: Diagnosis not present

## 2024-10-07 LAB — BAYER DCA HB A1C WAIVED: HB A1C (BAYER DCA - WAIVED): 12.7 % — ABNORMAL HIGH (ref 4.8–5.6)

## 2024-10-07 NOTE — Progress Notes (Signed)
 Established Patient Office Visit  Subjective   Patient ID: Krista Tate, female    DOB: [birth year]  Age: 43 y.o. MRN: [id number]  Chief Complaint  Patient presents with   Medical Management of Chronic Issues    HPI  History of Present Illness   Krista Tate is a 43 year old female with diabetes who presents for medication management and follow-up.  Hyperglycemia and diabetes management - Hemoglobin A1c is 12.7%. - Out of Actos  for approximately one week; awaiting mail-order refill. - Out of Tresiba  for approximately one week; using Lantus  as a substitute. - Financial constraints are impacting ability to afford medications, including Mounjaro ($50/month), Jardiance , and Tresiba , despite partial insurance coverage. - No chest pain or shortness of breath.  Dyslipidemia management - Confirmed fasting during last cholesterol check. - Resumed Zetia , which is delivered by mail.  Psychosocial and functional status - Work hours reduced to 32 hours per week, considered full-time by employer, resulting in financial strain. - Difficult past three months, including six weeks out for medical aid, leading to increased responsibilities at home.  - This has increased her stress - continues with celexa  and feels like it is beneficial        10/07/2024    3:10 PM 04/05/2024    1:23 PM 01/15/2024   10:50 AM  Depression screen PHQ 2/9  Decreased Interest 1 1 1   Down, Depressed, Hopeless 1 1 1   PHQ - 2 Score 2 2 2   Altered sleeping 2 2 1   Tired, decreased energy 2 2 1   Change in appetite 2 1 1   Feeling bad or failure about yourself  1 1 1   Trouble concentrating 1 1 1   Moving slowly or fidgety/restless 1 0 0  Suicidal thoughts 1 1 0  PHQ-9 Score 12 10  7    Difficult doing work/chores Somewhat difficult Somewhat difficult Somewhat difficult     Data saved with a previous flowsheet row definition      10/07/2024    3:11 PM 04/05/2024    1:23 PM 08/08/2023    8:51 AM 12/19/2022   11:36 AM   GAD 7 : Generalized Anxiety Score  Nervous, Anxious, on Edge 1 1 2 2   Control/stop worrying 1 2 2 2   Worry too much - different things 1 1 2 2   Trouble relaxing 1 2 2 2   Restless 1 1 2 2   Easily annoyed or irritable 1 2 2 2   Afraid - awful might happen 1 1 2 2   Total GAD 7 Score 7 10 14 14   Anxiety Difficulty Somewhat difficult Somewhat difficult Somewhat difficult Somewhat difficult       ROS As per HPI.    Objective:     BP 139/84   Pulse 98   Temp 98.2 F (36.8 C) (Temporal)   Ht 5' 6 (1.676 m)   Wt 209 lb 12.8 oz (95.2 kg)   SpO2 97%   BMI 33.86 kg/m  Wt Readings from Last 3 Encounters:  10/07/24 209 lb 12.8 oz (95.2 kg)  07/19/24 206 lb 6.4 oz (93.6 kg)  04/05/24 195 lb 6.4 oz (88.6 kg)      Physical Exam Vitals and nursing note reviewed.  Constitutional:      General: She is not in acute distress.    Appearance: She is not ill-appearing, toxic-appearing or diaphoretic.  Eyes:     General: No scleral icterus. Neck:     Thyroid : No thyroid  mass, thyromegaly or thyroid  tenderness.  Cardiovascular:  Rate and Rhythm: Normal rate and regular rhythm.     Heart sounds: Normal heart sounds. No murmur heard. Pulmonary:     Effort: Pulmonary effort is normal. No respiratory distress.     Breath sounds: Normal breath sounds.  Abdominal:     Tenderness: There is no rebound.  Musculoskeletal:     Cervical back: Normal range of motion. No rigidity.     Right lower leg: No edema.     Left lower leg: No edema.  Skin:    General: Skin is warm and dry.  Neurological:     General: No focal deficit present.     Mental Status: She is alert and oriented to person, place, and time.  Psychiatric:        Mood and Affect: Mood normal.        Behavior: Behavior normal.      No results found for any visits on 10/07/24.    The 10-year ASCVD risk score (Arnett DK, et al., 2019) is: 5.4%    Assessment & Plan:   [name] was seen today for medical management of  chronic issues.  Diagnoses and all orders for this visit:  Uncontrolled type 2 diabetes mellitus with hyperglycemia (HCC) -     Bayer DCA Hb A1c Waived -     Microalbumin / creatinine urine ratio  Long term current use of insulin (HCC)  Hypertension associated with diabetes (HCC) -     CBC with Differential/Platelet -     CMP14+EGFR  Dyslipidemia associated with type 2 diabetes mellitus (HCC)  Microalbuminuria due to type 2 diabetes mellitus (HCC)  Class 1 obesity due to excess calories with serious comorbidity and body mass index (BMI) of 33.0 to 33.9 in adult  Acquired hypothyroidism  Vitamin D  insufficiency -     VITAMIN D  25 Hydroxy (Vit-D Deficiency, Fractures)  Depression, major, recurrent, moderate (HCC)  Generalized anxiety disorder  Assessment and Plan    Type 2 diabetes mellitus with hyperglycemia and hypertension Type 2 diabetes uncontrolled with A1c 12.7, improved from previous of 14. Currently managing with basal insulin samples that she has been out of this for a few weeks. Also taking actos , glimepiride , and metformin . Other medications are currently unaffordable for patient.   - Tresiba  samples provided.  - Mounjaro currently no affordable.  - Will reach back out to PharmD regarding insulin prices, patient assistance.   HTN  - Continue losartan .   HLD - Continue crestor and zetia .  - Needs repeat but not fasting. Will plan for next visit.   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 Cannot afford SGLT2  Obesity - Diet, exercise, weight loss.   Hypothyroidism - Compliant with levothyroxine . Denies symptoms. Has been well controlled.   Vitamin D  - Level pending.   Depression/anxiety - Continue celexa . Denies SI. Declines changes in regimen.    Return in about 3 months (around 01/05/2025) for chronic follow up with fasting lipid.   The patient indicates understanding of these issues and agrees with the plan.  Krista CHRISTELLA Search, FNP

## 2024-10-08 ENCOUNTER — Ambulatory Visit: Payer: Self-pay | Admitting: Family Medicine

## 2024-10-08 ENCOUNTER — Encounter: Payer: Self-pay | Admitting: Family Medicine

## 2024-10-08 ENCOUNTER — Telehealth: Payer: Self-pay | Admitting: *Deleted

## 2024-10-08 DIAGNOSIS — E559 Vitamin D deficiency, unspecified: Secondary | ICD-10-CM | POA: Insufficient documentation

## 2024-10-08 DIAGNOSIS — R809 Proteinuria, unspecified: Secondary | ICD-10-CM

## 2024-10-08 LAB — CBC WITH DIFFERENTIAL/PLATELET
Basophils Absolute: 0.1 x10E3/uL (ref 0.0–0.2)
Basos: 1 %
EOS (ABSOLUTE): 0.5 x10E3/uL — ABNORMAL HIGH (ref 0.0–0.4)
Eos: 7 %
Hematocrit: 41.3 % (ref 34.0–46.6)
Hemoglobin: 12.7 g/dL (ref 11.1–15.9)
Immature Grans (Abs): 0 x10E3/uL (ref 0.0–0.1)
Immature Granulocytes: 0 %
Lymphocytes Absolute: 1.8 x10E3/uL (ref 0.7–3.1)
Lymphs: 23 %
MCH: 26.1 pg — ABNORMAL LOW (ref 26.6–33.0)
MCHC: 30.8 g/dL — ABNORMAL LOW (ref 31.5–35.7)
MCV: 85 fL (ref 79–97)
Monocytes Absolute: 0.5 x10E3/uL (ref 0.1–0.9)
Monocytes: 7 %
Neutrophils Absolute: 4.7 x10E3/uL (ref 1.4–7.0)
Neutrophils: 62 %
Platelets: 419 x10E3/uL (ref 150–450)
RBC: 4.87 x10E6/uL (ref 3.77–5.28)
RDW: 12.9 % (ref 11.7–15.4)
WBC: 7.6 x10E3/uL (ref 3.4–10.8)

## 2024-10-08 LAB — CMP14+EGFR
ALT: 21 IU/L (ref 0–32)
AST: 17 IU/L (ref 0–40)
Albumin: 3.9 g/dL (ref 3.9–4.9)
Alkaline Phosphatase: 41 IU/L (ref 41–116)
BUN/Creatinine Ratio: 19 (ref 9–23)
BUN: 22 mg/dL (ref 6–24)
Bilirubin Total: 0.2 mg/dL (ref 0.0–1.2)
CO2: 23 mmol/L (ref 20–29)
Calcium: 9.4 mg/dL (ref 8.7–10.2)
Chloride: 96 mmol/L (ref 96–106)
Creatinine, Ser: 1.13 mg/dL — AB (ref 0.57–1.00)
Globulin, Total: 2.5 g/dL (ref 1.5–4.5)
Glucose: 489 mg/dL — AB (ref 70–99)
Potassium: 5 mmol/L (ref 3.5–5.2)
Sodium: 132 mmol/L — AB (ref 134–144)
Total Protein: 6.4 g/dL (ref 6.0–8.5)
eGFR: 62 mL/min/1.73 (ref >59)

## 2024-10-08 LAB — VITAMIN D 25 HYDROXY (VIT D DEFICIENCY, FRACTURES): Vit D, 25-Hydroxy: 27.2 ng/mL — AB (ref 30.0–100.0)

## 2024-10-08 LAB — MICROALBUMIN / CREATININE URINE RATIO
Creatinine, Urine: 34.5 mg/dL
Microalb/Creat Ratio: 2483 mg/g{creat} — ABNORMAL HIGH (ref 0–29)
Microalbumin, Urine: 856.5 ug/mL

## 2024-10-08 MED ORDER — VITAMIN D (ERGOCALCIFEROL) 1.25 MG (50000 UNIT) PO CAPS: 50000.0000 [IU] | ORAL_CAPSULE | ORAL | 0 refills | Status: AC

## 2024-10-08 NOTE — Telephone Encounter (Signed)
 Critical lab  Glucose 489

## 2024-10-12 ENCOUNTER — Encounter: Payer: Self-pay | Admitting: Pharmacist

## 2024-10-12 ENCOUNTER — Telehealth: Payer: Self-pay | Admitting: Pharmacist

## 2024-10-12 NOTE — Telephone Encounter (Signed)
 Assisted with copay card for [facility]:  [email or web address]  My chart message sent to patient to encourage her to reach out to PHarmD if still not affordable  Mliss Tarry Griffin, PharmD, BCACP, CPP Clinical Pharmacist, [facility]

## 2024-10-18 ENCOUNTER — Other Ambulatory Visit: Payer: Self-pay | Admitting: Family Medicine

## 2024-10-18 DIAGNOSIS — I152 Hypertension secondary to endocrine disorders: Secondary | ICD-10-CM

## 2024-11-16 ENCOUNTER — Telehealth: Payer: Self-pay | Admitting: Pharmacist

## 2024-11-16 DIAGNOSIS — E119 Type 2 diabetes mellitus without complications: Secondary | ICD-10-CM

## 2024-11-16 NOTE — Telephone Encounter (Signed)
 High deductible plan A1c worsening, UACR >2K Referred to nephro Will attempt to use healthwell grant for high deductible [phone] placed to Pharmacy

## 2024-11-18 ENCOUNTER — Telehealth: Payer: Self-pay

## 2024-11-18 NOTE — Progress Notes (Signed)
 Care Guide Pharmacy Note  11/18/2024 Name: Krista Tate MRN: [id number] DOB: 03-Sep-1981  Referred By: Joesph Annabella HERO, FNP Reason for referral: Complex Care Management (Outreach to schedule with Pharm d )   Krista Tate is a 44 y.o. year old female who is a primary care patient of Joesph Annabella HERO, FNP.  Krista Tate was referred to the pharmacist for assistance related to: HTN, HLD, and DMII  An unsuccessful telephone outreach was attempted today to contact the patient who was referred to the pharmacy team for assistance with medication assistance. Additional attempts will be made to contact the patient. Jeoffrey Buffalo , RMA     [facility]  [facility], [facility] Guide  Direct Dial: [phone]  Website: [email or web address]

## 2024-11-19 NOTE — Progress Notes (Unsigned)
 Care Guide Pharmacy Note  11/19/2024 Name: Krista Tate MRN: [id number] DOB: 1981/03/30  Referred By: Joesph Annabella HERO, FNP Reason for referral: Complex Care Management (Outreach to schedule with Pharm d )   Krista Tate is a 44 y.o. year old female who is a primary care patient of Joesph Annabella HERO, FNP.  Krista Tate was referred to the pharmacist for assistance related to: DMII  A second unsuccessful telephone outreach was attempted today to contact the patient who was referred to the pharmacy team for assistance with medication assistance. Additional attempts will be made to contact the patient.  Jeoffrey Buffalo , RMA     [facility]  [facility], [facility] Guide  Direct Dial: [phone]  Website: [email or web address]

## 2024-11-22 NOTE — Progress Notes (Signed)
 Care Guide Pharmacy Note  11/22/2024 Name: Krista Tate MRN: [id number] DOB: 11/16/1980  Referred By: Joesph Annabella HERO, FNP Reason for referral: Complex Care Management (Outreach to schedule with Pharm d )   Krista Tate is a 44 y.o. year old female who is a primary care patient of Joesph Annabella HERO, FNP.  Krista Tate was referred to the pharmacist for assistance related to: DMII  Successful contact was made with the patient to discuss pharmacy services including being ready for the pharmacist to call at least 5 minutes before the scheduled appointment time and to have medication bottles and any blood pressure readings ready for review. The patient agreed to meet with the pharmacist via telephone visit on (date/time).12/03/2024  Jeoffrey Buffalo , RMA     [place]  [facility], [facility] Guide  Direct Dial: [phone]  Website: [email or web address]

## 2024-12-03 ENCOUNTER — Other Ambulatory Visit

## 2024-12-09 ENCOUNTER — Telehealth: Payer: Self-pay

## 2024-12-09 NOTE — Progress Notes (Signed)
 Complex Care Management Care Guide Note  12/09/2024 Name: Krista Tate MRN: [id number] DOB: May 21, 1981  Krista Tate is a 44 y.o. year old female who is a primary care patient of Joesph Annabella HERO, FNP and is actively engaged with the care management team. I reached out to Joen Burkes by phone today to assist with re-scheduling  with the Pharmacist.  Follow up plan: Telephone appointment with complex care management team member scheduled for:  01/14/2025  Jeoffrey Buffalo , RMA     [place]  [facility], [facility] Guide  Direct Dial: [phone]  Website: [email or web address]

## 2025-01-14 ENCOUNTER — Other Ambulatory Visit
